# Patient Record
Sex: Male | Born: 1935 | Race: Black or African American | Hispanic: No | Marital: Married | Smoking: Former smoker
Health system: Southern US, Community
[De-identification: ages and names within clinical notes are randomized; demographics above are authoritative.]

## PROBLEM LIST (undated history)

## (undated) DIAGNOSIS — I10 Essential (primary) hypertension: Secondary | ICD-10-CM

## (undated) DIAGNOSIS — I2510 Atherosclerotic heart disease of native coronary artery without angina pectoris: Secondary | ICD-10-CM

## (undated) DIAGNOSIS — I499 Cardiac arrhythmia, unspecified: Secondary | ICD-10-CM

## (undated) HISTORY — DX: Cardiac arrhythmia, unspecified: I49.9

## (undated) HISTORY — PX: VASCULAR SURGERY: SHX849

---

## 2004-07-17 ENCOUNTER — Ambulatory Visit: Payer: Self-pay | Admitting: Family Medicine

## 2004-07-25 ENCOUNTER — Ambulatory Visit: Payer: Self-pay | Admitting: Family Medicine

## 2004-12-09 ENCOUNTER — Ambulatory Visit: Payer: Self-pay | Admitting: Family Medicine

## 2008-07-15 ENCOUNTER — Emergency Department: Payer: Self-pay | Admitting: Internal Medicine

## 2009-07-16 ENCOUNTER — Emergency Department: Payer: Self-pay | Admitting: Emergency Medicine

## 2010-11-10 ENCOUNTER — Emergency Department: Payer: Self-pay | Admitting: Emergency Medicine

## 2014-02-08 ENCOUNTER — Encounter: Payer: Self-pay | Admitting: Surgery

## 2014-02-12 ENCOUNTER — Encounter: Payer: Self-pay | Admitting: Surgery

## 2014-02-13 LAB — WOUND AEROBIC CULTURE

## 2014-03-14 ENCOUNTER — Encounter: Payer: Self-pay | Admitting: Surgery

## 2015-04-07 ENCOUNTER — Emergency Department
Admission: EM | Admit: 2015-04-07 | Discharge: 2015-04-07 | Disposition: A | Discharge status: Home / Self Care | Payer: Medicare HMO | Attending: Emergency Medicine | Admitting: Emergency Medicine

## 2015-04-07 ENCOUNTER — Encounter: Payer: Self-pay | Admitting: Emergency Medicine

## 2015-04-07 ENCOUNTER — Emergency Department: Payer: Medicare HMO

## 2015-04-07 DIAGNOSIS — I10 Essential (primary) hypertension: Secondary | ICD-10-CM | POA: Insufficient documentation

## 2015-04-07 DIAGNOSIS — Y998 Other external cause status: Secondary | ICD-10-CM | POA: Diagnosis not present

## 2015-04-07 DIAGNOSIS — R2243 Localized swelling, mass and lump, lower limb, bilateral: Secondary | ICD-10-CM | POA: Diagnosis not present

## 2015-04-07 DIAGNOSIS — S29001A Unspecified injury of muscle and tendon of front wall of thorax, initial encounter: Secondary | ICD-10-CM | POA: Diagnosis present

## 2015-04-07 DIAGNOSIS — R079 Chest pain, unspecified: Secondary | ICD-10-CM

## 2015-04-07 DIAGNOSIS — Y9389 Activity, other specified: Secondary | ICD-10-CM | POA: Insufficient documentation

## 2015-04-07 DIAGNOSIS — Y9289 Other specified places as the place of occurrence of the external cause: Secondary | ICD-10-CM | POA: Insufficient documentation

## 2015-04-07 DIAGNOSIS — Z87891 Personal history of nicotine dependence: Secondary | ICD-10-CM | POA: Insufficient documentation

## 2015-04-07 DIAGNOSIS — S20212A Contusion of left front wall of thorax, initial encounter: Secondary | ICD-10-CM | POA: Diagnosis not present

## 2015-04-07 DIAGNOSIS — W01198A Fall on same level from slipping, tripping and stumbling with subsequent striking against other object, initial encounter: Secondary | ICD-10-CM | POA: Diagnosis not present

## 2015-04-07 DIAGNOSIS — W19XXXA Unspecified fall, initial encounter: Secondary | ICD-10-CM

## 2015-04-07 HISTORY — DX: Atherosclerotic heart disease of native coronary artery without angina pectoris: I25.10

## 2015-04-07 HISTORY — DX: Essential (primary) hypertension: I10

## 2015-04-07 NOTE — ED Notes (Signed)
Pt states  2 days ago he lost his balance and fell hitting left rib area on cabinet, states he continues to have pain and it is hard to take a deep breath

## 2015-04-07 NOTE — ED Notes (Signed)
Pt reports a fall 2 days ago; states he lost his balance and fell into the kitchen counter hitting his left side.  Pt complaining of pain to left lower ribcage and left upper abdomen. Pt A/O x4, hypertensive, other vitals WDL. MD at bedside.

## 2015-04-07 NOTE — ED Provider Notes (Addendum)
[facility] Emergency Department Provider Note  ____________________________________________  Time seen: Approximately 135 PM  I have reviewed the triage vital signs and the nursing notes.   HISTORY  Chief Complaint Fall    HPI Louis Lawson is a 81 y.o. male with a history of hypertension and CAD who fell into cabinets 2 days ago hitting his left side. He says that he has had increased pain over the past 2 days and now it is difficult for him to take a deep breath. He took a pain pill of her relatives at home but he does not know what it is. Did not hit his head or lose consciousness since falling 2 days ago. No lightheadedness. Denies any abdominal pain.Denies being on any blood thinners. Says that he fell due to losing his footing while walking.   Past Medical History  Diagnosis Date  . Hypertension   . Coronary artery disease     There are no active problems to display for this patient.   History reviewed. No pertinent past surgical history.  No current outpatient prescriptions on file.  Allergies Review of patient's allergies indicates no known allergies.  No family history on file.  Social History Social History  Substance Use Topics  . Smoking status: Former Games developer  . Smokeless tobacco: None  . Alcohol Use: Yes    Review of Systems Constitutional: No fever/chills Eyes: No visual changes. ENT: No sore throat. Cardiovascular: Left sided chest pain  Respiratory: Denies shortness of breath. Gastrointestinal: No abdominal pain.  No nausea, no vomiting.  No diarrhea.  No constipation. Genitourinary: Negative for dysuria. Musculoskeletal: Negative for back pain. Skin: Negative for rash. Neurological: Negative for headaches, focal weakness or numbness.  10-point ROS otherwise negative.  ____________________________________________   PHYSICAL EXAM:  VITAL SIGNS: ED Triage Vitals  Enc Vitals Group     BP 04/07/15 1327 186/79 mmHg      Pulse Rate 04/07/15 1327 73     Resp 04/07/15 1327 18     Temp 04/07/15 1327 98.1 F (36.7 C)     Temp Source 04/07/15 1327 Oral     SpO2 04/07/15 1327 94 %     Weight 04/07/15 1327 220 lb (99.791 kg)     Height 04/07/15 1327  (1.778 m)     Head Cir --      Peak Flow --      Pain Score 04/07/15 1328 6     Pain Loc --      Pain Edu? --      Excl. in GC? --     Constitutional: Alert and oriented. Well appearing and in no acute distress. Eyes: Conjunctivae are normal. PERRL. EOMI. Head: Atraumatic. Nose: No congestion/rhinnorhea. Mouth/Throat: Mucous membranes are moist.  Oropharynx non-erythematous. Neck: No stridor.   Cardiovascular: Normal rate, regular rhythm. Grossly normal heart sounds.  Good peripheral circulation. Respiratory: Normal respiratory effort.  No retractions. Lungs CTAB. Gastrointestinal: Soft and nontender. No distention.No CVA tenderness. There is also not any bruising over the abdomen. Musculoskeletal: Bilateral lower extremity edema which is chronic.  No joint effusions. Tenderness to palpation anteriorly and just lateral to the sternum on the left side without any crepitus or ecchymosis or overlying deformity. Tenderness is mild Neurologic:  Normal speech and language. No gross focal neurologic deficits are appreciated. No gait instability. Skin:  Skin is warm, dry and intact. No rash noted. Psychiatric: Mood and affect are normal. Speech and behavior are normal.  ____________________________________________   LABS (  all labs ordered are listed, but only abnormal results are displayed)  Labs Reviewed - No data to display ____________________________________________  EKG   ____________________________________________  RADIOLOGY  Negative rib series to the left. ____________________________________________   PROCEDURES   ____________________________________________   INITIAL IMPRESSION / ASSESSMENT AND PLAN / ED COURSE  Pertinent labs  & imaging results that were available during my care of the patient were reviewed by me and considered in my medical decision making (see chart for details).  ----------------------------------------- 2:42 PM on 04/07/2015 -----------------------------------------  Patient resting comfortably at this time. Patient reexamined and very minimal tenderness over the entire left hemithorax and no tenderness over the left upper quadrant of the abdomen. Given the patient's disease course of increasing pain over the last 2 days this sounds more like muscle bruising and chest wall contusion. I believe there is an extremely low probability of there being any internal injury such as to the spleen. The patient's blood pressure was elevated today. If there is any significant internal bleeding I think he would be showing systemic signs by this point. I reviewed the results with the patient as well as his relative who is at the bedside. We discussed treatment options such as a warm heating pad as well as a muscle cream such as Aspercreme or icy hot. They understand the plan and are willing to comply. Will be discharged to home. Will follow up with primary care doctor. ____________________________________________   FINAL CLINICAL IMPRESSION(S) / ED DIAGNOSES  Left-sided chest contusion secondary to mechanical fall.    Myrna Blazeravid Matthew Franz Svec, MD 04/07/15 1443  Myrna Blazeravid Matthew Tessah Patchen, MD 04/07/15 [phone]

## 2015-04-07 NOTE — Discharge Instructions (Signed)
Chest Contusion °A contusion is a deep bruise. Bruises happen when an injury causes bleeding under the skin. Signs of bruising include pain, puffiness (swelling), and discolored skin. The bruise may turn blue, purple, or yellow.  °HOME CARE °· Put ice on the injured area. °¨ Put ice in a plastic bag. °¨ Place a towel between the skin and the bag. °¨ Leave the ice on for 15-20 minutes at a time, 03-04 times a day for the first 48 hours. °· Only take medicine as told by your doctor. °· Rest. °· Take deep breaths (deep-breathing exercises) as told by your doctor. °· Stop smoking if you smoke. °· Do not lift objects over 5 pounds (2.3 kilograms) for 3 days or longer if told by your doctor. °GET HELP RIGHT AWAY IF:  °· You have more bruising or puffiness. °· You have pain that gets worse. °· You have trouble breathing. °· You are dizzy, weak, or pass out (faint). °· You have blood in your pee (urine) or poop (stool). °· You cough up or throw up (vomit) blood. °· Your puffiness or pain is not helped with medicines. °MAKE SURE YOU:  °· Understand these instructions. °· Will watch your condition. °· Will get help right away if you are not doing well or get worse. °  °This information is not intended to replace advice given to you by your health care provider. Make sure you discuss any questions you have with your health care provider. °  °Document Released: 09/17/2007 Document Revised: 12/24/2011 Document Reviewed: 09/22/2011 °Elsevier Interactive Patient Education ©2016 [employer] ° °

## 2015-04-07 NOTE — ED Notes (Signed)
Discussed discharge instructions and follow-up care with patient. No questions or concerns at this time. Pt stable at discharge.  

## 2015-04-07 NOTE — ED Notes (Signed)
Patient transported to X-ray 

## 2016-03-11 ENCOUNTER — Ambulatory Visit
Admission: EM | Admit: 2016-03-11 | Discharge: 2016-03-11 | Disposition: A | Discharge status: Home / Self Care | Payer: Medicare HMO | Attending: Internal Medicine | Admitting: Internal Medicine

## 2016-03-11 DIAGNOSIS — L03221 Cellulitis of neck: Secondary | ICD-10-CM | POA: Diagnosis not present

## 2016-03-11 MED ORDER — SULFAMETHOXAZOLE-TRIMETHOPRIM 800-160 MG PO TABS: 1.0000 | ORAL_TABLET | Freq: Two times a day (BID) | ORAL | 0 refills | Status: DC

## 2016-03-11 MED ORDER — CEPHALEXIN 500 MG PO CAPS: 500.0000 mg | ORAL_CAPSULE | Freq: Two times a day (BID) | ORAL | 0 refills | Status: DC

## 2016-03-11 NOTE — ED Provider Notes (Signed)
[facility]    CSN: [id number] Arrival date & time: 03/11/16  1543     History   Chief Complaint Chief Complaint  Patient presents with  . Abscess    back of neck    HPI Louis Lawson is a 82 y.o. male.  He presents today with 1 wk hx red slightly painful swollen area on back of neck.  Previously seen ?10 yrs ago with this, lanced without return of pus. Abx helped.  No fever, no malaise, not draining.    HPI  Past Medical History:  Diagnosis Date  . Coronary artery disease   . Hypertension     History reviewed. No pertinent surgical history.     Home Medications    Prior to Admission medications   Medication Sig Start Date End Date Taking? Authorizing Provider  amLODipine (NORVASC) 5 MG tablet Take 5 mg by mouth daily.   Yes Historical Provider, MD  Cholecalciferol (VITAMIN D PO) Take by mouth.   Yes Historical Provider, MD  furosemide (LASIX) 40 MG tablet Take 40 mg by mouth.   Yes Historical Provider, MD  metoprolol succinate (TOPROL-XL) 50 MG 24 hr tablet Take 50 mg by mouth daily. Take with or immediately following a meal.   Yes Historical Provider, MD  ramipril (ALTACE) 10 MG capsule Take 10 mg by mouth daily.   Yes Historical Provider, MD  simvastatin (ZOCOR) 20 MG tablet Take 20 mg by mouth daily.   Yes Historical Provider, MD  cephALEXin (KEFLEX) 500 MG capsule Take 1 capsule (500 mg total) by mouth 2 (two) times daily. 03/11/16   Eustace Moore, MD  sulfamethoxazole-trimethoprim (BACTRIM DS,SEPTRA DS) 800-160 MG tablet Take 1 tablet by mouth 2 (two) times daily. 03/11/16 03/21/16  Eustace Moore, MD    Family History History reviewed. No pertinent family history.  Social History Social History  Substance Use Topics  . Smoking status: Former Games developer  . Smokeless tobacco: Never Used  . Alcohol use Yes     Allergies   Patient has no known allergies.   Review of Systems Review of Systems  All other systems reviewed and are  negative.    Physical Exam Triage Vital Signs ED Triage Vitals  Enc Vitals Group     BP 03/11/16 1825 (!) 184/67     Pulse Rate 03/11/16 1825 73     Resp 03/11/16 1825 19     Temp 03/11/16 1825 97.6 F (36.4 C)     Temp Source 03/11/16 1825 Oral     SpO2 03/11/16 1825 100 %     Weight 03/11/16 1635 215 lb (97.5 kg)     Height 03/11/16 1635  (1.753 m)     Pain Score 03/11/16 1639 5   Updated Vital Signs BP (!) 184/67 (BP Location: Right Arm)   Pulse 73   Temp 97.6 F (36.4 C) (Oral)   Resp 19   Ht  (1.753 m)   Wt 215 lb (97.5 kg)   SpO2 100%   BMI 31.75 kg/m  Physical Exam  Constitutional: He is oriented to person, place, and time. No distress.  Alert, nicely groomed Sitting in wheelchair  HENT:  Head: Atraumatic.  Eyes:  Conjugate gaze, no eye redness/drainage  Neck: Neck supple.  Cardiovascular: Normal rate.   Pulmonary/Chest: No respiratory distress.  Abdominal: He exhibits no distension.  Musculoskeletal: Normal range of motion.  Neurological: He is alert and oriented to person, place, and time.  Skin: Skin is  warm and dry.  3" deep red indurated/raised lesion back of neck, minimal tenderness, not really fluctuant.  Not pointing.  Nursing note and vitals reviewed.    UC Treatments / Results   Procedures Procedures (including critical care time) Back of neck prepped with hibiclens and bleb of lidocaine 1% placed.  Attempt made x 3 to aspirate from lesion with 18 ga needle/10cc syringe, without success.  Bandaid applied.  Final Clinical Impressions(s) / UC Diagnoses   Final diagnoses:  Cellulitis, neck   Prescriptions for antibiotics sent to the [facility] in [place].  Recheck in 1 week if not improving, or if new fever>100.5 or increasing redness/swelling/pain/drainage.    New Prescriptions New Prescriptions   CEPHALEXIN (KEFLEX) 500 MG CAPSULE    Take 1 capsule (500 mg total) by mouth 2 (two) times daily.   SULFAMETHOXAZOLE-TRIMETHOPRIM  (BACTRIM DS,SEPTRA DS) 800-160 MG TABLET    Take 1 tablet by mouth 2 (two) times daily.     Eustace MooreLaura W Jerrik Housholder, MD 03/16/16 2236

## 2016-03-11 NOTE — Discharge Instructions (Addendum)
Prescriptions for antibiotics sent to the [facility] in [place].  Recheck in 1 week if not improving, or if new fever>100.5 or increasing redness/swelling/pain/drainage.

## 2016-03-11 NOTE — ED Triage Notes (Signed)
Pt has an abscess on the back of his neck, he had an abscess there about 8 years ago and had surgery on it. This time is started to fester about a week ago.

## 2016-03-18 ENCOUNTER — Ambulatory Visit
Admission: EM | Admit: 2016-03-18 | Discharge: 2016-03-18 | Disposition: A | Discharge status: Home / Self Care | Payer: Medicare HMO | Attending: Emergency Medicine | Admitting: Emergency Medicine

## 2016-03-18 DIAGNOSIS — L723 Sebaceous cyst: Secondary | ICD-10-CM

## 2016-03-18 DIAGNOSIS — IMO0002 Reserved for concepts with insufficient information to code with codable children: Secondary | ICD-10-CM

## 2016-03-18 DIAGNOSIS — L03818 Cellulitis of other sites: Secondary | ICD-10-CM | POA: Diagnosis not present

## 2016-03-18 MED ORDER — DOXYCYCLINE HYCLATE 100 MG PO CAPS: 100.0000 mg | ORAL_CAPSULE | Freq: Two times a day (BID) | ORAL | 0 refills | Status: AC

## 2016-03-18 NOTE — ED Triage Notes (Signed)
Patient states that he is here for a recheck of his abscess on his neck. Patient states that area feels somewhat better, soreness has improved some.

## 2016-03-18 NOTE — Discharge Instructions (Signed)
Stop the Bactrim. Start the doxycycline instead. You will finish the doxycycline on the same day that you finish the Keflex. Make sure you continue the Keflex. 500 mg of Tylenol 4 times a day as needed for pain.

## 2016-03-18 NOTE — ED Provider Notes (Addendum)
HPI  SUBJECTIVE:  Louis Lawson is a 82 y.o. male who presents with persistent redness, pain, swelling on the posterior aspect of his neck. States that the mass has been there for "several months". He states is getting slightly better, but is not getting worse. Denies fevers, drainage, states that he is compliant with his antibiotics. He is requesting something for pain, he has tried Tylenol ES one tab which she states helps. He has a past medical history of hypertension, hypercholesterolemia, gout, lower extremity edema, no history of kidney disease, diabetes.  Was seen here at this facility on 11/28 with an abscess on his neck. Several attempts made to aspirate from the lesion on 18-gauge needle without success. He was started on Keflex and Bactrim.    Past Medical History:  Diagnosis Date  . Coronary artery disease   . Hypertension     History reviewed. No pertinent surgical history.  History reviewed. No pertinent family history.  Social History  Substance Use Topics  . Smoking status: Former Games developermoker  . Smokeless tobacco: Never Used  . Alcohol use Yes    No current facility-administered medications for this encounter.   Current Outpatient Prescriptions:  .  amLODipine (NORVASC) 5 MG tablet, Take 5 mg by mouth daily., Disp: , Rfl:  .  cephALEXin (KEFLEX) 500 MG capsule, Take 1 capsule (500 mg total) by mouth 2 (two) times daily., Disp: 20 capsule, Rfl: 0 .  Cholecalciferol (VITAMIN D PO), Take by mouth., Disp: , Rfl:  .  furosemide (LASIX) 40 MG tablet, Take 40 mg by mouth., Disp: , Rfl:  .  metoprolol succinate (TOPROL-XL) 50 MG 24 hr tablet, Take 50 mg by mouth daily. Take with or immediately following a meal., Disp: , Rfl:  .  ramipril (ALTACE) 10 MG capsule, Take 10 mg by mouth daily., Disp: , Rfl:  .  simvastatin (ZOCOR) 20 MG tablet, Take 20 mg by mouth daily., Disp: , Rfl:  .  doxycycline (VIBRAMYCIN) 100 MG capsule, Take 1 capsule (100 mg total) by mouth 2 (two) times  daily., Disp: 6 capsule, Rfl: 0  No Known Allergies   ROS  As noted in HPI.   Physical Exam  BP (!) 166/65 (BP Location: Left Arm)   Pulse (!) 55   Temp 97.7 F (36.5 C) (Oral)   Resp 17   Ht 5\' 9"  (1.753 m)   Wt 215 lb (97.5 kg)   SpO2 100%   BMI 31.75 kg/m   Constitutional: Well developed, well nourished, no acute distress Eyes:  EOMI, conjunctiva normal bilaterally HENT: Normocephalic, atraumatic,mucus membranes moist Respiratory: Normal inspiratory effort Cardiovascular: Normal rate GI: nondistended skin:5.5 cm x 5.5 cm tender area of erythema, induration with central fluctuance posterior middle neck.  Lymph: No appreciable cervical lymphadenopathy.  Musculoskeletal: no deformities Neurologic: Alert & oriented x 3, no focal neuro deficits Psychiatric: Speech and behavior appropriate   ED Course   Medications - No data to display  Orders Placed This Encounter  Procedures  . Deep Wound or Surgical Culture (Abcess or Tissue Only)    Standing Status:   Standing    Number of Occurrences:   1    Order Specific Question:   Patient immune status    Answer:   Normal  . Ambulatory referral to General Surgery    Referral Priority:   Urgent    Referral Type:   Surgical    Referral Reason:   Specialty Services Required    Requested Specialty:   General  Surgery    Number of Visits Requested:   1    No results found for this or any previous visit (from the past 24 hour(s)). No results found.  ED Clinical Impression  Cyst of neck  Cellulitis of other specified site   ED Assessment/Plan  Previous records reviewed as noted in history of present illness  Procedure note, cleaned area with chlorhexidine and  alcohol. Marked area of induration with skin marker for reference. Anesthetized the area with 0.5 cc of plain lidocaine 1% with adequate anesthesia. Made a single stab wound with an 11 blade. Did not express any purulent material. Explored wound, and it appears  to be a fibrotic mass Sterile pressure dressing placed. Patient tolerated procedure well.  This is most likely a sebaceous cyst vs other cyst with some superficial cellulitis.  We'll have patient continue Keflex, DC the Bactrim due to concerns for hyperkalemia with the ACE inhibitor and start him on doxycycline to cover MRSA. He may take Tylenol 500 mg up to 4 times a day as needed for pain. We'll have him follow-up with  Dr. Everlene FarrierPabon  general surgeon on call to have this definitively removed. To the ER if gets worse.   Discussed sn/sx that should prompt return to the ED. Patient and family agrees with plan.   Meds ordered this encounter  Medications  . doxycycline (VIBRAMYCIN) 100 MG capsule    Sig: Take 1 capsule (100 mg total) by mouth 2 (two) times daily.    Dispense:  6 capsule    Refill:  0    *This clinic note was created using Scientist, clinical (histocompatibility and immunogenetics)Dragon dictation software. Therefore, there may be occasional mistakes despite careful proofreading.  ?    Domenick GongAshley Soloman Mckeithan, MD 03/18/16 1303    Domenick GongAshley Arnav Cregg, MD 03/18/16 [phone]

## 2016-03-28 ENCOUNTER — Ambulatory Visit (INDEPENDENT_AMBULATORY_CARE_PROVIDER_SITE_OTHER): Payer: Medicare HMO | Admitting: Surgery

## 2016-03-28 ENCOUNTER — Encounter: Payer: Self-pay | Admitting: Surgery

## 2016-03-28 VITALS — BP 178/81 | HR 77 | Temp 97.3°F | Ht 69.0 in | Wt 220.0 lb

## 2016-03-28 DIAGNOSIS — L723 Sebaceous cyst: Secondary | ICD-10-CM | POA: Diagnosis not present

## 2016-03-28 DIAGNOSIS — L089 Local infection of the skin and subcutaneous tissue, unspecified: Secondary | ICD-10-CM | POA: Diagnosis not present

## 2016-03-28 MED ORDER — DOXYCYCLINE HYCLATE 50 MG PO CAPS: 100.0000 mg | ORAL_CAPSULE | Freq: Two times a day (BID) | ORAL | 0 refills | Status: DC

## 2016-03-28 NOTE — Progress Notes (Signed)
Patient ID: Louis Lawson, male   DOB: February 28, 1923, 82 y.o.   MRN: [id number]  HPI Louis Lawson is a 82 y.o. male seen for an infected sebaceous cyst on the nuchal area. He states that he has had this cyst for several months but over the last week or so now has had some redness, some intermittent mild to moderate sharp pain that is not radiated. No fevers no chills he recently went to the emergency room 10 days ago and was prescribed doxycycline. There has been some improvement of symptoms. Patient has history of coronary artery disease and irregular heartbeat. He comes in with his son and he is in a wheelchair. He has decreased mobility and is somewhat debilitated  HPI  Past Medical History:  Diagnosis Date  . Coronary artery disease   . Hypertension   . Irregular heart beat     Past Surgical History:  Procedure Laterality Date  . VASCULAR SURGERY Right    Lower Extremity- secondary to DVT    Family History  Problem Relation Age of Onset  . Hypertension Sister     Social History Social History  Substance Use Topics  . Smoking status: Former Smoker    Quit date: 03/28/1964  . Smokeless tobacco: Never Used  . Alcohol use Yes     Comment: Occasional    No Known Allergies  Current Outpatient Prescriptions  Medication Sig Dispense Refill  . allopurinol (ZYLOPRIM) 300 MG tablet Take 1 tablet by mouth daily.    Marland Kitchen. amLODipine (NORVASC) 10 MG tablet Take 1 tablet by mouth daily.    . Cholecalciferol (VITAMIN D PO) Take by mouth.    . furosemide (LASIX) 40 MG tablet Take 40 mg by mouth.    . metoprolol succinate (TOPROL-XL) 50 MG 24 hr tablet Take 50 mg by mouth daily. Take with or immediately following a meal.    . ramipril (ALTACE) 10 MG capsule Take 10 mg by mouth daily.    . simvastatin (ZOCOR) 20 MG tablet Take 20 mg by mouth daily.    Marland Kitchen. doxycycline (VIBRAMYCIN) 50 MG capsule Take 2 capsules (100 mg total) by mouth 2 (two) times daily. 20 capsule 0   No current  facility-administered medications for this visit.      Review of Systems A 10 point review of systems was asked and was negative except for the information on the HPI  Physical Exam Blood pressure (!) 178/81, pulse 77, temperature 97.3 F (36.3 C), temperature source Oral, height 5\' 9"  (1.753 m), weight 99.8 kg (220 lb). CONSTITUTIONAL: No acute distress, nontoxic EYES: Pupils are equal, round, and reactive to light, Sclera are non-icteric. EARS, NOSE, MOUTH AND THROAT: The oropharynx is clear. The oral mucosa is pink and moist. Hearing is intact to voice. LYMPH NODES:  Lymph nodes in the neck are normal. RESPIRATORY: There is normal respiratory effort, without pathologic use of accessory muscles. GI: The abdomen is  soft, nontender, and nondistended. There are no palpable masses. There is no hepatosplenomegaly. There are normal bowel sounds in all quadrants. GU: Rectal deferred.   MUSCULOSKELETAL: Ext well perfused  No cyanosis or edema.   SKIN: There is a 3 cm x 4 cm infected sebaceous cyst on the posterior nuchal area. There is some white headaches but no evidence of abscess or necrotizing infection. NEUROLOGIC: Motor and sensation is grossly normal. Cranial nerves are grossly intact. PSYCH:  Oriented to person, place and time. Affect is normal.  Data Reviewed  I have personally reviewed  the patient's imaging, laboratory findings and medical records.    Assessment/ Plan Infected epidermal inclusion cyst. We'll treat her with another course of doxycycline since he responded very well with this. And eventually this will need to be excised was the inflammation and the infection resolves. Discussed with patient in detail that if obviously this does not improve may need to do a wider excision and left the wound open to heal by secondary intention. At this point there is no need for any emergency procedures at this time. Discussed with the patient in detail about his disease process and he  understands.  Sterling Bigiego Maisen Klingler, MD FACS General Surgeon 03/28/2016, 11:51 AM

## 2016-03-28 NOTE — Patient Instructions (Signed)
We will see you back in 1 month. Please see your appointment below.  Pick up your antibiotics and begin today. You will take these twice daily until they are gone. If this area begins to have worsening redness, inflammation or you develop a fever > 100.5, nausea/vomiting- call our office and speak with a nurse immediately.

## 2016-05-02 ENCOUNTER — Ambulatory Visit: Payer: Self-pay | Admitting: Surgery

## 2016-05-15 ENCOUNTER — Ambulatory Visit: Payer: Self-pay | Admitting: Surgery

## 2016-11-04 ENCOUNTER — Inpatient Hospital Stay: Payer: Medicare HMO

## 2016-11-04 ENCOUNTER — Inpatient Hospital Stay
Admission: EM | Admit: 2016-11-04 | Discharge: 2016-11-11 | DRG: 853 | Disposition: A | Discharge status: Skilled Nursing Facility | Payer: Medicare HMO | Attending: Internal Medicine | Admitting: Internal Medicine

## 2016-11-04 ENCOUNTER — Emergency Department: Payer: Medicare HMO | Admitting: Certified Registered"

## 2016-11-04 ENCOUNTER — Emergency Department: Payer: Medicare HMO

## 2016-11-04 ENCOUNTER — Encounter
Admission: EM | Disposition: A | Discharge status: Skilled Nursing Facility | Payer: Self-pay | Source: Home / Self Care | Attending: Internal Medicine

## 2016-11-04 DIAGNOSIS — K529 Noninfective gastroenteritis and colitis, unspecified: Secondary | ICD-10-CM

## 2016-11-04 DIAGNOSIS — K429 Umbilical hernia without obstruction or gangrene: Secondary | ICD-10-CM | POA: Diagnosis present

## 2016-11-04 DIAGNOSIS — I447 Left bundle-branch block, unspecified: Secondary | ICD-10-CM | POA: Diagnosis present

## 2016-11-04 DIAGNOSIS — R509 Fever, unspecified: Secondary | ICD-10-CM | POA: Diagnosis present

## 2016-11-04 DIAGNOSIS — K352 Acute appendicitis with generalized peritonitis: Secondary | ICD-10-CM | POA: Diagnosis not present

## 2016-11-04 DIAGNOSIS — J9601 Acute respiratory failure with hypoxia: Secondary | ICD-10-CM | POA: Diagnosis not present

## 2016-11-04 DIAGNOSIS — Z79899 Other long term (current) drug therapy: Secondary | ICD-10-CM

## 2016-11-04 DIAGNOSIS — I10 Essential (primary) hypertension: Secondary | ICD-10-CM | POA: Diagnosis present

## 2016-11-04 DIAGNOSIS — I2510 Atherosclerotic heart disease of native coronary artery without angina pectoris: Secondary | ICD-10-CM | POA: Diagnosis present

## 2016-11-04 DIAGNOSIS — K631 Perforation of intestine (nontraumatic): Secondary | ICD-10-CM | POA: Diagnosis not present

## 2016-11-04 DIAGNOSIS — K3532 Acute appendicitis with perforation and localized peritonitis, without abscess: Secondary | ICD-10-CM | POA: Diagnosis present

## 2016-11-04 DIAGNOSIS — A419 Sepsis, unspecified organism: Secondary | ICD-10-CM | POA: Diagnosis present

## 2016-11-04 DIAGNOSIS — J9690 Respiratory failure, unspecified, unspecified whether with hypoxia or hypercapnia: Secondary | ICD-10-CM | POA: Diagnosis present

## 2016-11-04 DIAGNOSIS — Z87891 Personal history of nicotine dependence: Secondary | ICD-10-CM

## 2016-11-04 HISTORY — PX: APPENDECTOMY: SHX54

## 2016-11-04 HISTORY — PX: LAPAROTOMY: SHX154

## 2016-11-04 HISTORY — PX: UMBILICAL HERNIA REPAIR: SHX196

## 2016-11-04 LAB — CBC WITH DIFFERENTIAL/PLATELET
BASOS PCT: 0 %
Basophils Absolute: 0 10*3/uL (ref 0–0.1)
Eosinophils Absolute: 0 10*3/uL (ref 0–0.7)
Eosinophils Relative: 0 %
HEMATOCRIT: 39.8 % — AB (ref 40.0–52.0)
HEMOGLOBIN: 13.4 g/dL (ref 13.0–18.0)
LYMPHS ABS: 1.2 10*3/uL (ref 1.0–3.6)
Lymphocytes Relative: 8 %
MCH: 31.5 pg (ref 26.0–34.0)
MCHC: 33.6 g/dL (ref 32.0–36.0)
MCV: 93.6 fL (ref 80.0–100.0)
MONO ABS: 1.2 10*3/uL — AB (ref 0.2–1.0)
MONOS PCT: 8 %
NEUTROS ABS: 13.1 10*3/uL — AB (ref 1.4–6.5)
NEUTROS PCT: 84 %
PLATELETS: 165 10*3/uL (ref 150–440)
RBC: 4.26 MIL/uL — ABNORMAL LOW (ref 4.40–5.90)
RDW: 15.2 % — ABNORMAL HIGH (ref 11.5–14.5)
WBC: 15.5 10*3/uL — ABNORMAL HIGH (ref 3.8–10.6)

## 2016-11-04 LAB — URINALYSIS, COMPLETE (UACMP) WITH MICROSCOPIC
BILIRUBIN URINE: NEGATIVE
Bacteria, UA: NONE SEEN
GLUCOSE, UA: NEGATIVE mg/dL
KETONES UR: NEGATIVE mg/dL
Leukocytes, UA: NEGATIVE
NITRITE: NEGATIVE
PH: 7 (ref 5.0–8.0)
Protein, ur: >=300 mg/dL — AB
SPECIFIC GRAVITY, URINE: 1.015 (ref 1.005–1.030)

## 2016-11-04 LAB — COMPREHENSIVE METABOLIC PANEL
ALBUMIN: 3.8 g/dL (ref 3.5–5.0)
ALT: 14 U/L — AB (ref 17–63)
ALT: 14 U/L — AB (ref 17–63)
ANION GAP: 10 (ref 5–15)
AST: 29 U/L (ref 15–41)
AST: 35 U/L (ref 15–41)
Albumin: 3.1 g/dL — ABNORMAL LOW (ref 3.5–5.0)
Alkaline Phosphatase: 47 U/L (ref 38–126)
Alkaline Phosphatase: 41 U/L (ref 38–126)
Anion gap: 10 (ref 5–15)
BUN: 23 mg/dL — AB (ref 6–20)
BUN: 22 mg/dL — ABNORMAL HIGH (ref 6–20)
CALCIUM: 8.1 mg/dL — AB (ref 8.9–10.3)
CHLORIDE: 99 mmol/L — AB (ref 101–111)
CHLORIDE: 101 mmol/L (ref 101–111)
CO2: 26 mmol/L (ref 22–32)
CO2: 23 mmol/L (ref 22–32)
CREATININE: 1.62 mg/dL — AB (ref 0.61–1.24)
CREATININE: 1.55 mg/dL — AB (ref 0.61–1.24)
Calcium: 9 mg/dL (ref 8.9–10.3)
GFR calc Af Amer: 41 mL/min — ABNORMAL LOW (ref >60)
GFR calc non Af Amer: 35 mL/min — ABNORMAL LOW (ref >60)
GFR, EST AFRICAN AMERICAN: 43 mL/min — AB (ref >60)
GFR, EST NON AFRICAN AMERICAN: 37 mL/min — AB (ref >60)
GLUCOSE: 137 mg/dL — AB (ref 65–99)
Glucose, Bld: 148 mg/dL — ABNORMAL HIGH (ref 65–99)
Potassium: 4 mmol/L (ref 3.5–5.1)
Potassium: 3.4 mmol/L — ABNORMAL LOW (ref 3.5–5.1)
SODIUM: 135 mmol/L (ref 135–145)
SODIUM: 134 mmol/L — AB (ref 135–145)
Total Bilirubin: 2 mg/dL — ABNORMAL HIGH (ref 0.3–1.2)
Total Bilirubin: 1.9 mg/dL — ABNORMAL HIGH (ref 0.3–1.2)
Total Protein: 8.1 g/dL (ref 6.5–8.1)
Total Protein: 6.9 g/dL (ref 6.5–8.1)

## 2016-11-04 LAB — TROPONIN I: Troponin I: 0.06 ng/mL (ref <0.03)

## 2016-11-04 LAB — MAGNESIUM: MAGNESIUM: 1.1 mg/dL — AB (ref 1.7–2.4)

## 2016-11-04 LAB — CBC
HCT: 45 % (ref 40.0–52.0)
HEMOGLOBIN: 15 g/dL (ref 13.0–18.0)
MCH: 32.1 pg (ref 26.0–34.0)
MCHC: 33.5 g/dL (ref 32.0–36.0)
MCV: 95.9 fL (ref 80.0–100.0)
PLATELETS: 156 10*3/uL (ref 150–440)
RBC: 4.69 MIL/uL (ref 4.40–5.90)
RDW: 15.4 % — AB (ref 11.5–14.5)
WBC: 7.2 10*3/uL (ref 3.8–10.6)

## 2016-11-04 LAB — BLOOD GAS, ARTERIAL
Acid-base deficit: 1.8 mmol/L (ref 0.0–2.0)
Bicarbonate: 24.3 mmol/L (ref 20.0–28.0)
FIO2: 0.3
MECHVT: 500 mL
O2 Saturation: 87.3 %
PATIENT TEMPERATURE: 37
PEEP: 5 cmH2O
PO2 ART: 57 mmHg — AB (ref 83.0–108.0)
RATE: 15 resp/min
pCO2 arterial: 45 mmHg (ref 32.0–48.0)
pH, Arterial: 7.34 — ABNORMAL LOW (ref 7.350–7.450)

## 2016-11-04 LAB — LACTIC ACID, PLASMA
LACTIC ACID, VENOUS: 3.6 mmol/L — AB (ref 0.5–1.9)
Lactic Acid, Venous: 2.6 mmol/L (ref 0.5–1.9)

## 2016-11-04 LAB — TRIGLYCERIDES: Triglycerides: 73 mg/dL (ref <150)

## 2016-11-04 LAB — MRSA PCR SCREENING: MRSA BY PCR: NEGATIVE

## 2016-11-04 LAB — LIPASE, BLOOD: Lipase: 21 U/L (ref 11–51)

## 2016-11-04 LAB — PHOSPHORUS: PHOSPHORUS: 3.2 mg/dL (ref 2.5–4.6)

## 2016-11-04 LAB — PROCALCITONIN: Procalcitonin: 1.35 ng/mL

## 2016-11-04 SURGERY — LAPAROTOMY, EXPLORATORY
Anesthesia: General | Site: Abdomen

## 2016-11-04 MED ORDER — FENTANYL CITRATE (PF) 100 MCG/2ML IJ SOLN
INTRAMUSCULAR | Status: AC
  Filled 2016-11-04: qty 2

## 2016-11-04 MED ORDER — SODIUM CHLORIDE 0.9% FLUSH
3.0000 mL | Freq: Two times a day (BID) | INTRAVENOUS | Status: DC
  Administered 2016-11-04 – 2016-11-11 (×11): 3 mL via INTRAVENOUS

## 2016-11-04 MED ORDER — SODIUM CHLORIDE 0.9 % IV SOLN: 1000.0000 mL | Freq: Once | INTRAVENOUS | Status: DC

## 2016-11-04 MED ORDER — ROCURONIUM BROMIDE 50 MG/5ML IV SOLN
INTRAVENOUS | Status: AC
  Filled 2016-11-04: qty 1

## 2016-11-04 MED ORDER — BUPIVACAINE LIPOSOME 1.3 % IJ SUSP
INTRAMUSCULAR | Status: AC
  Filled 2016-11-04: qty 20

## 2016-11-04 MED ORDER — ALBUMIN HUMAN 25 % IV SOLN
12.5000 g | Freq: Once | INTRAVENOUS | Status: AC
  Administered 2016-11-04: 12.5 g via INTRAVENOUS
  Filled 2016-11-04: qty 50

## 2016-11-04 MED ORDER — PROPOFOL 10 MG/ML IV BOLUS
INTRAVENOUS | Status: AC
  Filled 2016-11-04: qty 20

## 2016-11-04 MED ORDER — SUCCINYLCHOLINE CHLORIDE 20 MG/ML IJ SOLN
INTRAMUSCULAR | Status: DC | PRN
  Administered 2016-11-04: 100 mg via INTRAVENOUS

## 2016-11-04 MED ORDER — ONDANSETRON HCL 4 MG/2ML IJ SOLN
INTRAMUSCULAR | Status: DC | PRN
  Administered 2016-11-04: 4 mg via INTRAVENOUS

## 2016-11-04 MED ORDER — FENTANYL BOLUS VIA INFUSION
25.0000 ug | INTRAVENOUS | Status: DC | PRN
  Filled 2016-11-04: qty 25

## 2016-11-04 MED ORDER — FAMOTIDINE IN NACL 20-0.9 MG/50ML-% IV SOLN
20.0000 mg | Freq: Two times a day (BID) | INTRAVENOUS | Status: DC
  Administered 2016-11-04 – 2016-11-09 (×11): 20 mg via INTRAVENOUS
  Filled 2016-11-04 (×13): qty 50

## 2016-11-04 MED ORDER — DEXAMETHASONE SODIUM PHOSPHATE 10 MG/ML IJ SOLN
INTRAMUSCULAR | Status: DC | PRN
  Administered 2016-11-04: 5 mg via INTRAVENOUS

## 2016-11-04 MED ORDER — PANTOPRAZOLE SODIUM 40 MG IV SOLR
40.0000 mg | Freq: Two times a day (BID) | INTRAVENOUS | Status: DC
  Administered 2016-11-04: 40 mg via INTRAVENOUS
  Filled 2016-11-04: qty 40

## 2016-11-04 MED ORDER — LACTATED RINGERS IV SOLN
INTRAVENOUS | Status: DC | PRN
  Administered 2016-11-04: 15:00:00 via INTRAVENOUS

## 2016-11-04 MED ORDER — PIPERACILLIN-TAZOBACTAM 3.375 G IVPB 30 MIN
3.3750 g | Freq: Once | INTRAVENOUS | Status: AC
  Administered 2016-11-04: 3.375 g via INTRAVENOUS
  Filled 2016-11-04: qty 50

## 2016-11-04 MED ORDER — FENTANYL CITRATE (PF) 2500 MCG/50ML IJ SOLN: 25.0000 ug/h | INTRAMUSCULAR | Status: DC

## 2016-11-04 MED ORDER — SODIUM CHLORIDE 0.9 % IV SOLN: 250.0000 mL | INTRAVENOUS | Status: DC | PRN

## 2016-11-04 MED ORDER — IOPAMIDOL (ISOVUE-300) INJECTION 61%
100.0000 mL | Freq: Once | INTRAVENOUS | Status: AC | PRN
  Administered 2016-11-04: 75 mL via INTRAVENOUS

## 2016-11-04 MED ORDER — PANTOPRAZOLE SODIUM 40 MG IV SOLR: 40.0000 mg | INTRAVENOUS | Status: DC

## 2016-11-04 MED ORDER — SODIUM CHLORIDE 0.9% FLUSH: 3.0000 mL | INTRAVENOUS | Status: DC | PRN

## 2016-11-04 MED ORDER — HEPARIN SODIUM (PORCINE) 5000 UNIT/ML IJ SOLN
5000.0000 [IU] | Freq: Three times a day (TID) | INTRAMUSCULAR | Status: DC
  Administered 2016-11-04 – 2016-11-11 (×21): 5000 [IU] via SUBCUTANEOUS
  Filled 2016-11-04 (×21): qty 1

## 2016-11-04 MED ORDER — ACETAMINOPHEN 325 MG PO TABS
650.0000 mg | ORAL_TABLET | ORAL | Status: DC | PRN
  Administered 2016-11-07 – 2016-11-10 (×3): 650 mg via ORAL
  Filled 2016-11-04 (×4): qty 2

## 2016-11-04 MED ORDER — SUCCINYLCHOLINE CHLORIDE 20 MG/ML IJ SOLN
INTRAMUSCULAR | Status: AC
  Filled 2016-11-04: qty 1

## 2016-11-04 MED ORDER — FENTANYL CITRATE (PF) 100 MCG/2ML IJ SOLN
50.0000 ug | Freq: Once | INTRAMUSCULAR | Status: DC
Start: 2016-11-04 — End: 2016-11-10

## 2016-11-04 MED ORDER — ONDANSETRON HCL 4 MG/2ML IJ SOLN: 4.0000 mg | Freq: Four times a day (QID) | INTRAMUSCULAR | Status: DC | PRN

## 2016-11-04 MED ORDER — LACTATED RINGERS IV SOLN
INTRAVENOUS | Status: DC
  Administered 2016-11-04: 18:00:00 via INTRAVENOUS
  Administered 2016-11-05: 150 mL/h via INTRAVENOUS
  Administered 2016-11-05: 01:00:00 via INTRAVENOUS
  Administered 2016-11-05: 150 mL/h via INTRAVENOUS
  Administered 2016-11-05 – 2016-11-08 (×8): via INTRAVENOUS

## 2016-11-04 MED ORDER — VANCOMYCIN HCL IN DEXTROSE 1-5 GM/200ML-% IV SOLN
1000.0000 mg | Freq: Once | INTRAVENOUS | Status: AC
  Administered 2016-11-04: 1000 mg via INTRAVENOUS
  Filled 2016-11-04: qty 200

## 2016-11-04 MED ORDER — EPINEPHRINE PF 1 MG/ML IJ SOLN
INTRAMUSCULAR | Status: AC
  Filled 2016-11-04: qty 1

## 2016-11-04 MED ORDER — GLYCOPYRROLATE 0.2 MG/ML IJ SOLN
INTRAMUSCULAR | Status: AC
  Filled 2016-11-04: qty 1

## 2016-11-04 MED ORDER — DEXAMETHASONE SODIUM PHOSPHATE 10 MG/ML IJ SOLN
INTRAMUSCULAR | Status: AC
  Filled 2016-11-04: qty 1

## 2016-11-04 MED ORDER — LIDOCAINE HCL (CARDIAC) 20 MG/ML IV SOLN
INTRAVENOUS | Status: DC | PRN
  Administered 2016-11-04: 100 mg via INTRAVENOUS

## 2016-11-04 MED ORDER — LACTATED RINGERS IV SOLN
INTRAVENOUS | Status: DC | PRN
  Administered 2016-11-04: 16:00:00 via INTRAVENOUS

## 2016-11-04 MED ORDER — BUPIVACAINE-EPINEPHRINE (PF) 0.25% -1:200000 IJ SOLN
INTRAMUSCULAR | Status: DC | PRN
  Administered 2016-11-04: 30 mL via PERINEURAL

## 2016-11-04 MED ORDER — PROPOFOL 1000 MG/100ML IV EMUL: 5.0000 ug/kg/min | INTRAVENOUS | Status: DC

## 2016-11-04 MED ORDER — BUPIVACAINE HCL (PF) 0.25 % IJ SOLN
INTRAMUSCULAR | Status: AC
  Filled 2016-11-04: qty 30

## 2016-11-04 MED ORDER — SODIUM CHLORIDE 0.9 % IV SOLN
INTRAVENOUS | Status: DC | PRN
  Administered 2016-11-04: 60 mL

## 2016-11-04 MED ORDER — SODIUM CHLORIDE 0.9 % IV BOLUS (SEPSIS): 1000.0000 mL | Freq: Once | INTRAVENOUS | Status: DC

## 2016-11-04 MED ORDER — VANCOMYCIN HCL IN DEXTROSE 1-5 GM/200ML-% IV SOLN
1000.0000 mg | INTRAVENOUS | Status: DC
  Filled 2016-11-04: qty 200

## 2016-11-04 MED ORDER — SODIUM CHLORIDE 0.9 % IJ SOLN
INTRAMUSCULAR | Status: AC
  Filled 2016-11-04: qty 50

## 2016-11-04 MED ORDER — ONDANSETRON 4 MG PO TBDP
4.0000 mg | ORAL_TABLET | Freq: Four times a day (QID) | ORAL | Status: DC | PRN
  Filled 2016-11-04: qty 1

## 2016-11-04 MED ORDER — PROPOFOL 10 MG/ML IV BOLUS
INTRAVENOUS | Status: DC | PRN
  Administered 2016-11-04: 100 mg via INTRAVENOUS

## 2016-11-04 MED ORDER — SODIUM CHLORIDE 0.9 % IV SOLN
1000.0000 mL | Freq: Once | INTRAVENOUS | Status: AC
  Administered 2016-11-04: 1000 mL via INTRAVENOUS

## 2016-11-04 MED ORDER — FENTANYL 2500MCG IN NS 250ML (10MCG/ML) PREMIX INFUSION
25.0000 ug/h | INTRAVENOUS | Status: DC
Start: 2016-11-04 — End: 2016-11-05

## 2016-11-04 MED ORDER — EPHEDRINE SULFATE 50 MG/ML IJ SOLN
INTRAMUSCULAR | Status: AC
  Filled 2016-11-04: qty 1

## 2016-11-04 MED ORDER — ONDANSETRON HCL 4 MG/2ML IJ SOLN
INTRAMUSCULAR | Status: AC
  Filled 2016-11-04: qty 2

## 2016-11-04 MED ORDER — ROCURONIUM BROMIDE 100 MG/10ML IV SOLN
INTRAVENOUS | Status: DC | PRN
  Administered 2016-11-04: 5 mg via INTRAVENOUS
  Administered 2016-11-04: 10 mg via INTRAVENOUS
  Administered 2016-11-04: 35 mg via INTRAVENOUS
  Administered 2016-11-04: 50 mg via INTRAVENOUS

## 2016-11-04 MED ORDER — PIPERACILLIN-TAZOBACTAM 3.375 G IVPB
3.3750 g | Freq: Three times a day (TID) | INTRAVENOUS | Status: DC
  Administered 2016-11-04 – 2016-11-10 (×14): 3.375 g via INTRAVENOUS
  Filled 2016-11-04 (×15): qty 50

## 2016-11-04 MED ORDER — PHENYLEPHRINE HCL 10 MG/ML IJ SOLN
INTRAMUSCULAR | Status: AC
  Filled 2016-11-04: qty 1

## 2016-11-04 MED ORDER — LIDOCAINE HCL (PF) 2 % IJ SOLN
INTRAMUSCULAR | Status: AC
  Filled 2016-11-04: qty 2

## 2016-11-04 MED ORDER — FENTANYL CITRATE (PF) 100 MCG/2ML IJ SOLN
INTRAMUSCULAR | Status: DC | PRN
Start: 2016-11-04 — End: 2016-11-04
  Administered 2016-11-04 (×2): 25 ug via INTRAVENOUS
  Administered 2016-11-04: 50 ug via INTRAVENOUS

## 2016-11-04 SURGICAL SUPPLY — 49 items
APPLIER CLIP 11 MED OPEN (CLIP) ×4
APPLIER CLIP 13 LRG OPEN (CLIP)
BANDAGE GZE STRCH 3 X5YDS N/ST (GAUZE/BANDAGES/DRESSINGS) ×4 IMPLANT
BLADE CLIPPER SURG (BLADE) ×4 IMPLANT
BLADE SURG 15 STRL LF DISP TIS (BLADE) ×2 IMPLANT
BLADE SURG 15 STRL SS (BLADE) ×2
BULB RESERV EVAC DRAIN JP 100C (MISCELLANEOUS) ×4 IMPLANT
CANISTER SUCT 3000ML PPV (MISCELLANEOUS) ×4 IMPLANT
CHLORAPREP W/TINT 26ML (MISCELLANEOUS) ×4 IMPLANT
CLIP APPLIE 11 MED OPEN (CLIP) ×2 IMPLANT
CLIP APPLIE 13 LRG OPEN (CLIP) IMPLANT
DRAIN CHANNEL JP 19F (MISCELLANEOUS) ×4 IMPLANT
DRAPE LAPAROTOMY 100X77 ABD (DRAPES) ×4 IMPLANT
DRAPE TABLE BACK 80X90 (DRAPES) ×4 IMPLANT
DRSG TEGADERM 2-3/8X2-3/4 SM (GAUZE/BANDAGES/DRESSINGS) IMPLANT
DRSG TELFA 3X8 NADH (GAUZE/BANDAGES/DRESSINGS) IMPLANT
ELECT BLADE 6.5 EXT (BLADE) ×4 IMPLANT
ELECT REM PT RETURN 9FT ADLT (ELECTROSURGICAL) ×4
ELECTRODE REM PT RTRN 9FT ADLT (ELECTROSURGICAL) ×2 IMPLANT
GAUZE SPONGE 4X4 12PLY STRL (GAUZE/BANDAGES/DRESSINGS) ×4 IMPLANT
GLOVE BIO SURGEON STRL SZ7 (GLOVE) ×4 IMPLANT
GOWN STRL REUS W/ TWL LRG LVL3 (GOWN DISPOSABLE) ×4 IMPLANT
GOWN STRL REUS W/TWL LRG LVL3 (GOWN DISPOSABLE) ×4
HANDLE SUCTION POOLE (INSTRUMENTS) ×2 IMPLANT
HANDLE YANKAUER SUCT BULB TIP (MISCELLANEOUS) ×4 IMPLANT
LIGASURE IMPACT 36 18CM CVD LR (INSTRUMENTS) ×4 IMPLANT
NEEDLE HYPO 22GX1.5 SAFETY (NEEDLE) IMPLANT
NEEDLE HYPO 25X1 1.5 SAFETY (NEEDLE) IMPLANT
PACK BASIN MAJOR ARMC (MISCELLANEOUS) ×4 IMPLANT
PACK COLON CLEAN CLOSURE (MISCELLANEOUS) ×4 IMPLANT
RELOAD PROXIMATE 75MM BLUE (ENDOMECHANICALS) IMPLANT
SPONGE LAP 18X18 5 PK (GAUZE/BANDAGES/DRESSINGS) IMPLANT
SPONGE LAP 18X36 2PK (MISCELLANEOUS) IMPLANT
STAPLER PROXIMATE 75MM BLUE (STAPLE) ×4 IMPLANT
STAPLER SKIN PROX 35W (STAPLE) ×4 IMPLANT
SUCTION POOLE HANDLE (INSTRUMENTS) ×4
SUT PDS AB 1 TP1 96 (SUTURE) IMPLANT
SUT SILK 2 0 (SUTURE) ×2
SUT SILK 2 0 SH CR/8 (SUTURE) ×4 IMPLANT
SUT SILK 2 0SH CR/8 30 (SUTURE) ×4 IMPLANT
SUT SILK 2-0 18XBRD TIE 12 (SUTURE) ×2 IMPLANT
SUT VIC AB 0 CT1 36 (SUTURE) IMPLANT
SUT VIC AB 2-0 CT2 27 (SUTURE) ×4 IMPLANT
SUT VIC AB 2-0 SH 27 (SUTURE) ×2
SUT VIC AB 2-0 SH 27XBRD (SUTURE) ×2 IMPLANT
SYR 30ML LL (SYRINGE) IMPLANT
SYR 3ML LL SCALE MARK (SYRINGE) IMPLANT
TAPE MICROFOAM 4IN (TAPE) ×4 IMPLANT
TRAY FOLEY W/METER SILVER 16FR (SET/KITS/TRAYS/PACK) ×4 IMPLANT

## 2016-11-04 NOTE — Anesthesia Procedure Notes (Signed)
Procedure Name: Intubation Date/Time: 11/04/2016 3:44 PM Performed by: Silvana Newness Pre-anesthesia Checklist: Patient identified, Emergency Drugs available, Suction available, Patient being monitored and Timeout performed Patient Re-evaluated:Patient Re-evaluated prior to induction Oxygen Delivery Method: Circle system utilized Preoxygenation: Pre-oxygenation with 100% oxygen Induction Type: IV induction and Cricoid Pressure applied Ventilation: Mask ventilation without difficulty Laryngoscope Size: Mac and 4 Grade View: Grade II Tube type: Oral Tube size: 7.5 mm Number of attempts: 1 Airway Equipment and Method: Stylet Placement Confirmation: ETT inserted through vocal cords under direct vision,  positive ETCO2 and breath sounds checked- equal and bilateral Secured at: 21 cm Tube secured with: Tape Dental Injury: Teeth and Oropharynx as per pre-operative assessment

## 2016-11-04 NOTE — Progress Notes (Signed)
Pharmacy Antibiotic Note  Louis Lawson is a 83 y.o. male admitted on 11/04/2016 with sepsis.  Pharmacy has been consulted for vancomycin and piperacillin/tazobactam dosing.  Plan: Piperacillin/tazobactam 3.375 g IV q8h EI  Vancomycin 1000 mg given in ED.  Will order vancomycin 1000 mg IV q24h (6 hour stack) Goal VT 15-20 mcg/mL VT ordered 7/26 which will be prior to steady state, but want to ensure not supratherapeutic given patient's age.   Kinetics: Adjusted body weight = 85 kg, CrCl = 35 mL/min Ke: 0.033 Half-life: 21 hrs Vd: 59 L Cmin (est) ~15 mcg/mL  Height: 5\' 11"  (180.3 cm) Weight: 220 lb (99.8 kg) IBW/kg (Calculated) : 75.3  Temp (24hrs), Avg:103 F (39.4 C), Min:103 F (39.4 C), Max:103 F (39.4 C)   Recent Labs Lab 11/04/16 1040 11/04/16 1121  WBC  --  15.5*  CREATININE  --  1.62*  LATICACIDVEN 2.6*  --     Estimated Creatinine Clearance: 35 mL/min (A) (by C-G formula based on SCr of 1.62 mg/dL (H)).    No Known Allergies  Antimicrobials this admission: vancomycin 7/24 >>  Piperacillin/tazobactam 7/24 >>   Dose adjustments this admission:  Microbiology results: 7/24 BCx: Sent 7/24 UCx: Sent   Thank you for allowing pharmacy to be a part of this patient's care.  Cindi CarbonMary M Emalie Mcwethy, PharmD, BCPS Clinical Pharmacist 11/04/2016 1:13 PM

## 2016-11-04 NOTE — ED Notes (Addendum)
Bed battery for weighing dead, pt gives most recent weight.

## 2016-11-04 NOTE — ED Notes (Signed)
Pharmacy called and notified that there is no vanc or zosyn in pyxis. States they will send it.

## 2016-11-04 NOTE — Anesthesia Post-op Follow-up Note (Cosign Needed)
Anesthesia QCDR form completed.        

## 2016-11-04 NOTE — ED Provider Notes (Signed)
[facility] Emergency Department Provider Note   ____________________________________________    I have reviewed the triage vital signs and the nursing notes.   HISTORY  Chief Complaint Weakness  Patient is a poor historian  HPI Louis Lawson is a 83 y.o. male who presents with weakness as well as abdominal pain and fever.EMS reports that someone was trying to get the patient to the car to take him to the doctor today but the patient was too weak to ambulate. EMS reports fever and tachycardia. Patient reports he has some belly pain but reports normal stools. He denies cough. Denies dysuria. Symptoms started yesterday but got worse today. He has not taken anything for this. He has never had this before   Past Medical History:  Diagnosis Date  . Coronary artery disease   . Hypertension   . Irregular heart beat     There are no active problems to display for this patient.   Past Surgical History:  Procedure Laterality Date  . VASCULAR SURGERY Right    Lower Extremity- secondary to DVT    Prior to Admission medications   Medication Sig Start Date End Date Taking? Authorizing Provider  allopurinol (ZYLOPRIM) 300 MG tablet Take 1 tablet by mouth daily. 02/27/16   [provider]  amLODipine (NORVASC) 10 MG tablet Take 1 tablet by mouth daily. 02/27/16   [provider]  Cholecalciferol (VITAMIN D PO) Take by mouth.    [provider]  doxycycline (VIBRAMYCIN) 50 MG capsule Take 2 capsules (100 mg total) by mouth 2 (two) times daily. 03/28/16   Pabon, Diego F, MD  furosemide (LASIX) 40 MG tablet Take 40 mg by mouth.    [provider]  metoprolol succinate (TOPROL-XL) 50 MG 24 hr tablet Take 50 mg by mouth daily. Take with or immediately following a meal.    [provider]  ramipril (ALTACE) 10 MG capsule Take 10 mg by mouth daily.    [provider]  simvastatin (ZOCOR) 20 MG tablet Take 20 mg by  mouth daily.    [provider]     Allergies Patient has no known allergies.  Family History  Problem Relation Age of Onset  . Hypertension Sister     Social History Social History  Substance Use Topics  . Smoking status: Former Smoker    Quit date: 03/28/1964  . Smokeless tobacco: Never Used  . Alcohol use Yes     Comment: Occasional    Review of Systems  Constitutional: Positive fever Eyes: Denies visual changes.  ENT: Denies sore throat. Cardiovascular: Denies chest pain. Respiratory: Denies cough Gastrointestinal: As above, no vomiting Genitourinary: Negative for dysuria. Musculoskeletal: Negative for joint swelling Skin: Negative for rash. Neurological: Negative for headaches or weakness   ____________________________________________   PHYSICAL EXAM:  VITAL SIGNS: ED Triage Vitals  Enc Vitals Group     BP 11/04/16 1036 (!) 189/91     Pulse Rate 11/04/16 1036 (!) 113     Resp 11/04/16 1036 (!) 22     Temp 11/04/16 1036 (!) 103 F (39.4 C)     Temp Source 11/04/16 1036 Oral     SpO2 --      Weight 11/04/16 1037 99.8 kg (220 lb)     Height --      Head Circumference --      Peak Flow --      Pain Score --      Pain Loc --  Pain Edu? --      Excl. in GC? --     Constitutional: Alert and oriented.Ill-appearing Eyes: Conjunctivae are normal.  Head: Atraumatic. Nose: No congestion/rhinnorhea. Mouth/Throat: Mucous membranes are moist.   Neck:  Painless ROM Cardiovascular: Tachycardia, regular rhythm. Grossly normal heart sounds.  Good peripheral circulation. Respiratory: Normal respiratory effort.  No retractions. Lungs CTAB. Gastrointestinal: Distended abdomen, mild tender to palpation in all quadrants .  No CVA tenderness. Genitourinary: deferred Musculoskeletal: 2+ edema bilaterally lower extremities. Warm and well perfused Neurologic:  Normal speech and language. No gross focal neurologic deficits are appreciated.  Skin:  Skin is  warm, dry and intact. No rash noted. Psychiatric: Mood and affect are normal. Speech and behavior are normal.  ____________________________________________   LABS (all labs ordered are listed, but only abnormal results are displayed)  Labs Reviewed  CULTURE, BLOOD (ROUTINE X 2)  CULTURE, BLOOD (ROUTINE X 2)  URINE CULTURE  LACTIC ACID, PLASMA  LACTIC ACID, PLASMA  COMPREHENSIVE METABOLIC PANEL  CBC WITH DIFFERENTIAL/PLATELET  TROPONIN I  LIPASE, BLOOD  PROCALCITONIN  URINALYSIS, COMPLETE (UACMP) WITH MICROSCOPIC   ____________________________________________  EKG  ED ECG REPORT I, Jene Every, the attending physician, personally viewed and interpreted this ECG.  Date: 11/04/2016  Rate:113 Rhythm: Sinus tachycardia QRS Axis: normal Intervals: Left bundle branch block ST/T Wave abnormalities: normal   ____________________________________________  RADIOLOGY  CT shows small bowel inflammation and likely perforation ____________________________________________   PROCEDURES  Procedure(s) performed: No    Critical Care performed: yes  CRITICAL CARE Performed by: Jene Every   Total critical care time: 30 minutes  Critical care time was exclusive of separately billable procedures and treating other patients.  Critical care was necessary to treat or prevent imminent or life-threatening deterioration.  Critical care was time spent personally by me on the following activities: development of treatment plan with patient and/or surrogate as well as nursing, discussions with consultants, evaluation of patient's response to treatment, examination of patient, obtaining history from patient or surrogate, ordering and performing treatments and interventions, ordering and review of laboratory studies, ordering and review of radiographic studies, pulse oximetry and re-evaluation of patient's condition.  ____________________________________________   INITIAL  IMPRESSION / ASSESSMENT AND PLAN / ED COURSE  Pertinent labs & imaging results that were available during my care of the patient were reviewed by me and considered in my medical decision making (see chart for details).  Patient presents with primary complaint of abdominal pain and weakness. He is tachycardic and febrile, code sepsis initiated. Broad-spectrum antibiotics ordered.  CT scan demonstrates likely microperforation, probable cause of his sepsis. Discussed with Dr. Everlene Farrier of surgery who will see the patient in the ED.      ____________________________________________   FINAL CLINICAL IMPRESSION(S) / ED DIAGNOSES  Final diagnoses:  Small bowel perforation (HCC)  Enteritis  Sepsis, due to unspecified organism [facility])      NEW MEDICATIONS STARTED DURING THIS VISIT:  New Prescriptions   No medications on file     Note:  This document was prepared using Dragon voice recognition software and may include unintentional dictation errors.    Jene Every, MD 11/04/16 [phone]

## 2016-11-04 NOTE — Op Note (Signed)
PROCEDURES: 1. Laparotomy 2. Appendectomy 3. Placement # 19 FR blake drain 4. Umbilical hernia repair  Pre-operative Diagnosis: sepsis perforated viscus  Post-operative Diagnosis: perforated appendix  Surgeon: Merri Rayiego F Eldon Zietlow   Assistants: Dr. Thelma Bargeaks   Anesthesia: General endotracheal anesthesia  ASA Class: 3   Surgeon: Sterling Bigiego Erine Phenix , MD FACS  Anesthesia: Gen. with endotracheal tube  Findings: Perforated appendicitis [facility] Reactive inflammatory response Ileum  Ascitis   Estimated Blood Loss: 20cc         Specimens: appendix        Complications: none                Procedure Details  The patient was seen again in the Holding Room. The benefits, complications, treatment options, and expected outcomes were discussed with the patient. The risks of bleeding, infection, recurrence of symptoms, failure to resolve symptoms,  bowel injury, any of which could require further surgery were reviewed with the patient.   The patient was taken to Operating Room, identified as Louis Lawson and the procedure verified.  A Time Out was held and the above information confirmed.  Prior to the induction of general anesthesia, antibiotic prophylaxis was administered. VTE prophylaxis was in place. General endotracheal anesthesia was then administered and tolerated well. After the induction, the abdomen was prepped with Chloraprep and draped in the sterile fashion. The patient was positioned in the supine position.  Laparotomy performed with a 10 blade knife and electrocautery was used to dissect through the cutaneous tissue. The abdominal cavity was entered after incising the fascia and a generous incision was created. We immediately saw a foul-smelling ascites that we aspirated. There was significant inflammatory response around the mesentery of the terminal ileum and there was a necrotic and perforated appendix. We elevated the appendix and using the LigaSure device we divided the mesentery of the  appendix. Appendix was divided with GIA-75 stapler in standard fashion.  We ran the small bowel from the ligament of Treitz all the way to the ileocecal valve and there was no evidence of any perforation. There was some evidence of a small bowel diverticulosis at the level of the jejunum without evidence of diverticulitis of perforation. The NG tube was in place. We also note that there was significant dilated small bowel from an ileus caused by the perforated appendix.  We changed gloves and using a clean tray we closed the abdominal fascia also incorporating the umbilical hernia defect after excising the sac. We is a #1 PDS to close the fascia. Given the sepsis, H and contamination of the case I decided not to close the skin. The wound was irrigated and a wet to dry dressing was placed. After some of Marcaine was injected throughout the abdominal wall   Needle and laparotomy count were correct and there were no immediate complications Sterling Bigiego Nakeisha Greenhouse, MD, FACS

## 2016-11-04 NOTE — ED Notes (Signed)
Code Sepsis called at 1045

## 2016-11-04 NOTE — ED Triage Notes (Signed)
Pt presents to ER c/o abdominal pain X 2 days, weakness. Pt febrile. Tachycardic with EMS. CBG 147 with EMS. Pt alert and oriented X4, active, cooperative, pt in NAD. RR even and unlabored, color WNL.

## 2016-11-04 NOTE — Anesthesia Preprocedure Evaluation (Signed)
Anesthesia Evaluation  Patient identified by MRN, date of birth, ID band Patient awake    Reviewed: Allergy & Precautions, NPO status , Patient's Chart, lab work & pertinent test results, reviewed documented beta blocker date and time   Airway Mallampati: II  TM Distance: >3 FB     Dental   Pulmonary former smoker,    Pulmonary exam normal        Cardiovascular hypertension, Pt. on medications and Pt. on home beta blockers + CAD and + Peripheral Vascular Disease  Normal cardiovascular exam+ dysrhythmias      Neuro/Psych negative neurological ROS  negative psych ROS   GI/Hepatic Neg liver ROS, perforation   Endo/Other  negative endocrine ROS  Renal/GU negative Renal ROS  negative genitourinary   Musculoskeletal negative musculoskeletal ROS (+)   Abdominal Normal abdominal exam  (+)   Peds negative pediatric ROS (+)  Hematology negative hematology ROS (+)   Anesthesia Other Findings   Reproductive/Obstetrics                             Anesthesia Physical Anesthesia Plan  ASA: III and emergent  Anesthesia Plan: General   Post-op Pain Management:    Induction: Intravenous, Cricoid pressure planned and Rapid sequence  PONV Risk Score and Plan: 3 and Ondansetron, Dexamethasone, Propofol and Midazolam  Airway Management Planned: Oral ETT  Additional Equipment:   Intra-op Plan:   Post-operative Plan: Extubation in OR  Informed Consent: I have reviewed the patients History and Physical, chart, labs and discussed the procedure including the risks, benefits and alternatives for the proposed anesthesia with the patient or authorized representative who has indicated his/her understanding and acceptance.   Dental advisory given  Plan Discussed with: CRNA and Surgeon  Anesthesia Plan Comments:         Anesthesia Quick Evaluation

## 2016-11-04 NOTE — Consult Note (Signed)
Name: Louis Lawson MRN: [id number] DOB: [birth year]     CONSULTATION DATE: 11/04/2016  REFERRING MD :  Everlene FarrierPabon  CHIEF COMPLAINT: Respiratory failure  STUDIES:  Chest x-ray 724 I have Independently reviewed images of  CXR   on 11/04/2016 Interpretation: No acute process no acute pneumonia and no opacities noted    HISTORY OF PRESENT ILLNESS:   83 year old male admitted to the ICU postop for acute restaurant failure Patient was having abdominal symptoms abdominal pain nausea vomiting for the last several days Patient had acute abdomen and Gen. surgery to patient to the OR emergently and found to have a perforated appendix  Patient underwent emergent exploratory laparoscopy with hernia repair as well Patient admitted to the ICU postop restaurant failure Patient remains intubated not on any sedation Abdominal exam reveals distention along with bandages in place  Patient remains critically ill at this time  PAST MEDICAL HISTORY :   has a past medical history of Coronary artery disease; Hypertension; and Irregular heart beat.  has a past surgical history that includes Vascular surgery (Right). Prior to Admission medications   Medication Sig Start Date End Date Taking? Authorizing Provider  allopurinol (ZYLOPRIM) 300 MG tablet Take 1 tablet by mouth daily. 02/27/16  Yes [provider]  amLODipine (NORVASC) 10 MG tablet Take 1 tablet by mouth daily. 02/27/16  Yes [provider]  cholecalciferol (VITAMIN D) 1000 units tablet Take 1,000 Units by mouth daily.    Yes [provider]  furosemide (LASIX) 40 MG tablet Take 40 mg by mouth daily.    Yes [provider]  metoprolol succinate (TOPROL-XL) 50 MG 24 hr tablet Take 50 mg by mouth daily. Take with or immediately following a meal.   Yes [provider]  ramipril (ALTACE) 10 MG capsule Take 10 mg by mouth 2 (two) times daily.    Yes [provider]  simvastatin (ZOCOR) 20 MG tablet  Take 20 mg by mouth daily.   Yes [provider]   No Known Allergies  FAMILY HISTORY:  family history includes Hypertension in his sister. SOCIAL HISTORY:  reports that he quit smoking about 52 years ago. He has never used smokeless tobacco. He reports that he drinks alcohol. He reports that he does not use drugs.  REVIEW OF SYSTEMS:   Unable to obtain due to critical illness  ALL OTHER ROS ARE NEGATIVE    VITAL SIGNS: Temp:  [102 F (38.9 C)-103 F (39.4 C)] 102 F (38.9 C) (07/24 1721) Pulse Rate:  [97-115] 97 (07/24 1721) Resp:  [21-35] 22 (07/24 1330) BP: (136-190)/(74-104) 156/74 (07/24 1721) SpO2:  [94 %-100 %] 100 % (07/24 1721) Weight:  [220 lb (99.8 kg)] 220 lb (99.8 kg) (07/24 1037)  Physical Examination:  GENERAL:critically ill appearing, +resp distress HEAD: Normocephalic, atraumatic.  EYES: Pupils equal, round, reactive to light.  No scleral icterus.  MOUTH: Moist mucosal membrane. NECK: Supple. No thyromegaly. No nodules. No JVD.  PULMONARY: +rhonchi, +wheezing CARDIOVASCULAR: S1 and S2. Regular rate and rhythm. No murmurs, rubs, or gallops.  GASTROINTESTINAL: Soft, nontender, -distended. No masses. Positive bowel sounds. No hepatosplenomegaly.  MUSCULOSKELETAL: No swelling, clubbing, or edema.  NEUROLOGIC: obtunded SKIN:intact,warm,dry        Recent Labs Lab 11/04/16 1121  NA 135  K 4.0  CL 99*  CO2 26  BUN 23*  CREATININE 1.62*  GLUCOSE 137*    Recent Labs Lab 11/04/16 1121  HGB 13.4  HCT 39.8*  WBC 15.5*  PLT 165  ASSESSMENT / PLAN: 83 year old male admitted to the ICU for postop respiratory failure likely from acidosis and shock related to perforated appendix with acute abdomen  #1 continue ventilatory support #2 wean oxygen as needed #3 sedation as needed #4 vasopressors as needed to keep map above 65 Continue ICU monitoring DVT and GI prophylaxis ordered   Critical Care Time devoted to patient care services  described in this note is 55 minutes.   Overall, patient is critically ill, prognosis is guarded.  Patient with Multiorgan failure and at high risk for cardiac arrest and death.    Lucie Leather, M.D.  [facility]  Medical Director [facility] Medical Director [facility] Cardio-Pulmonary Department

## 2016-11-04 NOTE — Progress Notes (Signed)
Pt currently on spont mode 7/5 45% without complications.

## 2016-11-04 NOTE — Progress Notes (Signed)
Pt extubated without complications. Pt on room air Sao2 96%. Bs bilateral and equal, clear/diminshed. No stridor or wheezes. No signs of respiratory distress noted at this time.

## 2016-11-04 NOTE — ED Notes (Signed)
consent for surgery has been signed.

## 2016-11-04 NOTE — Progress Notes (Signed)
Kasa MD assessed the patient. Verbal orders to not start sedation. Attempt to wake the patient to assess if extubation is possible.

## 2016-11-04 NOTE — Transfer of Care (Signed)
Immediate Anesthesia Transfer of Care Note  Patient: Louis Lawson  Procedure(s) Performed: Procedure(s): EXPLORATORY LAPAROTOMY (N/A) APPENDECTOMY (N/A) HERNIA REPAIR UMBILICAL ADULT (N/A)  Patient Location: PACU  Anesthesia Type:General  Level of Consciousness: sedated and patient cooperative  Airway & Oxygen Therapy: Patient remains intubated per anesthesia plan  Post-op Assessment: Report given to RN and Post -op Vital signs reviewed and stable  Post vital signs: Reviewed and stable  Last Vitals:  Vitals:   11/04/16 1430 11/04/16 1721  BP: (!) 155/84 (!) 156/74  Pulse:  97  Resp:    Temp:  (!) 38.9 C    Last Pain:  Vitals:   11/04/16 1036  TempSrc: Oral         Complications: No apparent anesthesia complications

## 2016-11-04 NOTE — ED Notes (Signed)
X-ray at bedside

## 2016-11-04 NOTE — H&P (Signed)
Patient ID: Louis Lawson, male   DOB: 08/14/1922, 83 y.o.   MRN: [id number]  HPI Louis Lawson is a 83 y.o. male asked to see in consultation by Dr. Kinner. H/P taken from the daughter as pt is confused. Two day ago started having diffuse abdominal pain, N/V. Pain turned severe today and pt was brought via EMS, found tachy and febrile. Apparently he walks with a cane and has no signs of dementia. He pays his own bills and lives with his wife. Hx of CAD but no evidence of angina or dyspnea. Stent placed 10 years ago. CT scan pers reviewed evidence of thick SB w fluid and some extraluminal air.  Most of the history is taken from the family as pt is confused Wbc 16 and lactate is elevated as well as creat. ekg p. Reviewed sinus tachy LBBB  HPI  Past Medical History:  Diagnosis Date  . Coronary artery disease   . Hypertension   . Irregular heart beat     Past Surgical History:  Procedure Laterality Date  . VASCULAR SURGERY Right    Lower Extremity- secondary to DVT    Family History  Problem Relation Age of Onset  . Hypertension Sister     Social History Social History  Substance Use Topics  . Smoking status: Former Smoker    Quit date: 03/28/1964  . Smokeless tobacco: Never Used  . Alcohol use Yes     Comment: Occasional    No Known Allergies  Current Facility-Administered Medications  Medication Dose Route Frequency Provider Last Rate Last Dose  . 0.9 %  sodium chloride infusion  1,000 mL Intravenous Once Kinner, Robert, MD      . 0.9 %  sodium chloride infusion  1,000 mL Intravenous Once Kinner, Robert, MD      . piperacillin-tazobactam (ZOSYN) IVPB 3.375 g  3.375 g Intravenous Q8H Swayne, Mary M, RPH      . sodium chloride 0.9 % bolus 1,000 mL  1,000 mL Intravenous Once Pabon, Diego F, MD      . vancomycin (VANCOCIN) IVPB 1000 mg/200 mL premix  1,000 mg Intravenous Q24H Swayne, Mary M, RPH       Current Outpatient Prescriptions  Medication Sig Dispense Refill  .  allopurinol (ZYLOPRIM) 300 MG tablet Take 1 tablet by mouth daily.    . amLODipine (NORVASC) 10 MG tablet Take 1 tablet by mouth daily.    . cholecalciferol (VITAMIN D) 1000 units tablet Take 1,000 Units by mouth daily.     . furosemide (LASIX) 40 MG tablet Take 40 mg by mouth daily.     . metoprolol succinate (TOPROL-XL) 50 MG 24 hr tablet Take 50 mg by mouth daily. Take with or immediately following a meal.    . ramipril (ALTACE) 10 MG capsule Take 10 mg by mouth 2 (two) times daily.     . simvastatin (ZOCOR) 20 MG tablet Take 20 mg by mouth daily.       Review of Systems Full ROS  was asked  To the family and was negative except for the information on the HPI  Physical Exam Blood pressure (!) 190/96, pulse (!) 115, temperature (!) 103 F (39.4 C), temperature source Oral, resp. rate (!) 35, height 5' 11" (1.803 m), weight 99.8 kg (220 lb), SpO2 95 %. CONSTITUTIONAL: Tachycardic and confused EYES: Pupils are equal, round, and reactive to light, Sclera are non-icteric. EARS, NOSE, MOUTH AND THROAT: The oropharynx is clear. The oral mucosa is pink and dry.   Hearing is intact to voice. LYMPH NODES:  Lymph nodes in the neck are normal. RESPIRATORY:  Lungs are clear. There is normal respiratory effort, with equal breath sounds bilaterally, and without pathologic use of accessory muscles. CARDIOVASCULAR: Heart is regular without murmurs, gallops, or rubs Sinus tachy. GI: The abdomen is  Distended with rebound, + rovsing and obvious peritoneal signs. Reducible [facility]. GU: Rectal deferred.   MUSCULOSKELETAL: Normal muscle strength and tone. No cyanosis or edema.   SKIN: Turgor is good and there are no pathologic skin lesions or ulcers. NEUROLOGIC: Motor and sensation is grossly normal. Cranial nerves are grossly intact. PSYCH:  He is disoriented to place and time, follows commands  Data Reviewed I have personally reviewed the patient's imaging, laboratory findings and medical records.     Assessment/Plan  83 yo male reasonably functional with an acute abdomen and sepsis. Likely form perforated ulcer vs sb diverticulitis vs ischemic bowel. D/W family in detail about the gravity of the situation. Options of aggressive intervention to include emergent laparotomy vs medical rx vs hospice care. I provided extensive counseling to the family and explained the risks, benefits and possible complication of each treatment to include but not limited to resp failure, bleeding, infection and death. EVEN IF WE PROCEED W LAPAROTOMY HE IS AT INCREASE RISKS FOR MORBIDITY AND MORTALITY. They understand this and have decided  To proceed with laparotomy despite all potential fatal risks. This is obviously an emergency care, we will notify the OR and expedite his care. He is currently full code We have started aggressive fluid resusc and A/B rx .     Diego Pabon, MD FACS General Surgeon 11/04/2016, 2:02 PM   

## 2016-11-04 NOTE — Anesthesia Procedure Notes (Signed)
Arterial Line Insertion Start/End7/24/2018 3:55 PM, 11/04/2016 4:02 PM  Patient location: OR. Preanesthetic checklist: patient identified, IV checked, site marked, risks and benefits discussed, surgical consent, monitors and equipment checked, pre-op evaluation, timeout performed and anesthesia consent radial was placed Catheter size: 20 Fr Hand hygiene performed  and maximum sterile barriers used   Attempts: 2 Procedure performed without using ultrasound guided technique. Following insertion, dressing applied. Post procedure assessment: normal and unchanged

## 2016-11-04 NOTE — Consult Note (Signed)
Patient ID: Louis Lawson, male   DOB: 06-01-1922, 83 y.o.   MRN: [id number]  HPI Louis Lawson is a 83 y.o. male asked to see in consultation by Dr. Cyril Loosen. H/P taken from the daughter as pt is confused. Two day ago started having diffuse abdominal pain, N/V. Pain turned severe today and pt was brought via EMS, found tachy and febrile. Apparently he walks with a cane and has no signs of dementia. He pays his own bills and lives with his wife. Hx of CAD but no evidence of angina or dyspnea. Stent placed 10 years ago. CT scan pers reviewed evidence of thick SB w fluid and some extraluminal air.  Most of the history is taken from the family as pt is confused Wbc 16 and lactate is elevated as well as creat. ekg p. Reviewed sinus tachy LBBB  HPI  Past Medical History:  Diagnosis Date  . Coronary artery disease   . Hypertension   . Irregular heart beat     Past Surgical History:  Procedure Laterality Date  . VASCULAR SURGERY Right    Lower Extremity- secondary to DVT    Family History  Problem Relation Age of Onset  . Hypertension Sister     Social History Social History  Substance Use Topics  . Smoking status: Former Smoker    Quit date: 03/28/1964  . Smokeless tobacco: Never Used  . Alcohol use Yes     Comment: Occasional    No Known Allergies  Current Facility-Administered Medications  Medication Dose Route Frequency Provider Last Rate Last Dose  . 0.9 %  sodium chloride infusion  1,000 mL Intravenous Once Jene Every, MD      . 0.9 %  sodium chloride infusion  1,000 mL Intravenous Once Jene Every, MD      . piperacillin-tazobactam (ZOSYN) IVPB 3.375 g  3.375 g Intravenous Q8H Swayne, Mary M, RPH      . sodium chloride 0.9 % bolus 1,000 mL  1,000 mL Intravenous Once Pabon, [place] F, MD      . vancomycin (VANCOCIN) IVPB 1000 mg/200 mL premix  1,000 mg Intravenous Q24H Cindi Carbon, [facility]       Current Outpatient Prescriptions  Medication Sig Dispense Refill  .  allopurinol (ZYLOPRIM) 300 MG tablet Take 1 tablet by mouth daily.    Marland Kitchen amLODipine (NORVASC) 10 MG tablet Take 1 tablet by mouth daily.    . cholecalciferol (VITAMIN D) 1000 units tablet Take 1,000 Units by mouth daily.     . furosemide (LASIX) 40 MG tablet Take 40 mg by mouth daily.     . metoprolol succinate (TOPROL-XL) 50 MG 24 hr tablet Take 50 mg by mouth daily. Take with or immediately following a meal.    . ramipril (ALTACE) 10 MG capsule Take 10 mg by mouth 2 (two) times daily.     . simvastatin (ZOCOR) 20 MG tablet Take 20 mg by mouth daily.       Review of Systems Full ROS  was asked  To the family and was negative except for the information on the HPI  Physical Exam Blood pressure (!) 190/96, pulse (!) 115, temperature (!) 103 F (39.4 C), temperature source Oral, resp. rate (!) 35, height 5\' 11"  (1.803 m), weight 99.8 kg (220 lb), SpO2 95 %. CONSTITUTIONAL: Tachycardic and confused EYES: Pupils are equal, round, and reactive to light, Sclera are non-icteric. EARS, NOSE, MOUTH AND THROAT: The oropharynx is clear. The oral mucosa is pink and dry.  Hearing is intact to voice. LYMPH NODES:  Lymph nodes in the neck are normal. RESPIRATORY:  Lungs are clear. There is normal respiratory effort, with equal breath sounds bilaterally, and without pathologic use of accessory muscles. CARDIOVASCULAR: Heart is regular without murmurs, gallops, or rubs Sinus tachy. GI: The abdomen is  Distended with rebound, + rovsing and obvious peritoneal signs. Reducible UH. GU: Rectal deferred.   MUSCULOSKELETAL: Normal muscle strength and tone. No cyanosis or edema.   SKIN: Turgor is good and there are no pathologic skin lesions or ulcers. NEUROLOGIC: Motor and sensation is grossly normal. Cranial nerves are grossly intact. PSYCH:  He is disoriented to place and time, follows commands  Data Reviewed I have personally reviewed the patient's imaging, laboratory findings and medical records.     Assessment/Plan  83 yo male reasonably functional with an acute abdomen and sepsis. Likely form perforated ulcer vs sb diverticulitis vs ischemic bowel. D/W family in detail about the gravity of the situation. Options of aggressive intervention to include emergent laparotomy vs medical rx vs hospice care. I provided extensive counseling to the family and explained the risks, benefits and possible complication of each treatment to include but not limited to resp failure, bleeding, infection and death. EVEN IF WE PROCEED W LAPAROTOMY HE IS AT INCREASE RISKS FOR MORBIDITY AND MORTALITY. They understand this and have decided  To proceed with laparotomy despite all potential fatal risks. This is obviously an emergency care, we will notify the OR and expedite his care. He is currently full code We have started aggressive fluid resusc and A/B rx .     Sterling Bigiego Pabon, MD FACS General Surgeon 11/04/2016, 2:02 PM

## 2016-11-05 ENCOUNTER — Encounter: Payer: Self-pay | Admitting: Surgery

## 2016-11-05 ENCOUNTER — Inpatient Hospital Stay: Payer: Medicare HMO

## 2016-11-05 LAB — COMPREHENSIVE METABOLIC PANEL
ALBUMIN: 2.9 g/dL — AB (ref 3.5–5.0)
ALT: 13 U/L — AB (ref 17–63)
AST: 28 U/L (ref 15–41)
Alkaline Phosphatase: 31 U/L — ABNORMAL LOW (ref 38–126)
Anion gap: 7 (ref 5–15)
BILIRUBIN TOTAL: 1.9 mg/dL — AB (ref 0.3–1.2)
BUN: 23 mg/dL — AB (ref 6–20)
CHLORIDE: 104 mmol/L (ref 101–111)
CO2: 26 mmol/L (ref 22–32)
CREATININE: 1.27 mg/dL — AB (ref 0.61–1.24)
Calcium: 8.1 mg/dL — ABNORMAL LOW (ref 8.9–10.3)
GFR calc Af Amer: 55 mL/min — ABNORMAL LOW (ref >60)
GFR calc non Af Amer: 47 mL/min — ABNORMAL LOW (ref >60)
GLUCOSE: 159 mg/dL — AB (ref 65–99)
POTASSIUM: 3.8 mmol/L (ref 3.5–5.1)
Sodium: 137 mmol/L (ref 135–145)
Total Protein: 6.8 g/dL (ref 6.5–8.1)

## 2016-11-05 LAB — CBC
HEMATOCRIT: 40.5 % (ref 40.0–52.0)
HEMOGLOBIN: 13.6 g/dL (ref 13.0–18.0)
MCH: 31.9 pg (ref 26.0–34.0)
MCHC: 33.5 g/dL (ref 32.0–36.0)
MCV: 95.1 fL (ref 80.0–100.0)
Platelets: 141 10*3/uL — ABNORMAL LOW (ref 150–440)
RBC: 4.26 MIL/uL — AB (ref 4.40–5.90)
RDW: 15.4 % — ABNORMAL HIGH (ref 11.5–14.5)
WBC: 12.5 10*3/uL — ABNORMAL HIGH (ref 3.8–10.6)

## 2016-11-05 LAB — URINE CULTURE: Culture: <10000 — AB

## 2016-11-05 LAB — PHOSPHORUS: Phosphorus: 2.7 mg/dL (ref 2.5–4.6)

## 2016-11-05 LAB — APTT: aPTT: 42 seconds — ABNORMAL HIGH (ref 24–36)

## 2016-11-05 LAB — MAGNESIUM: Magnesium: 1.3 mg/dL — ABNORMAL LOW (ref 1.7–2.4)

## 2016-11-05 LAB — GLUCOSE, CAPILLARY: Glucose-Capillary: 116 mg/dL — ABNORMAL HIGH (ref 65–99)

## 2016-11-05 MED ORDER — ALLOPURINOL 300 MG PO TABS: 300.0000 mg | ORAL_TABLET | Freq: Every day | ORAL | Status: DC

## 2016-11-05 MED ORDER — SIMVASTATIN 20 MG PO TABS: 20.0000 mg | ORAL_TABLET | Freq: Every day | ORAL | Status: DC

## 2016-11-05 MED ORDER — AMLODIPINE BESYLATE 10 MG PO TABS: 10.0000 mg | ORAL_TABLET | Freq: Every day | ORAL | Status: DC

## 2016-11-05 MED ORDER — HYDRALAZINE HCL 20 MG/ML IJ SOLN: 10.0000 mg | INTRAMUSCULAR | Status: DC | PRN

## 2016-11-05 MED ORDER — ONDANSETRON HCL 4 MG/2ML IJ SOLN: 4.0000 mg | Freq: Once | INTRAMUSCULAR | Status: DC | PRN

## 2016-11-05 MED ORDER — FUROSEMIDE 20 MG PO TABS: 40.0000 mg | ORAL_TABLET | Freq: Every day | ORAL | Status: DC

## 2016-11-05 MED ORDER — MAGNESIUM SULFATE 4 GM/100ML IV SOLN
4.0000 g | Freq: Once | INTRAVENOUS | Status: AC
  Administered 2016-11-05: 4 g via INTRAVENOUS
  Filled 2016-11-05: qty 100

## 2016-11-05 MED ORDER — FENTANYL CITRATE (PF) 100 MCG/2ML IJ SOLN: 25.0000 ug | INTRAMUSCULAR | Status: DC | PRN

## 2016-11-05 MED ORDER — VANCOMYCIN HCL IN DEXTROSE 1-5 GM/200ML-% IV SOLN
1000.0000 mg | INTRAVENOUS | Status: DC
  Filled 2016-11-05: qty 200

## 2016-11-05 MED ORDER — RAMIPRIL 10 MG PO CAPS
10.0000 mg | ORAL_CAPSULE | Freq: Two times a day (BID) | ORAL | Status: DC
  Filled 2016-11-05: qty 1

## 2016-11-05 MED ORDER — METOPROLOL SUCCINATE ER 50 MG PO TB24
50.0000 mg | ORAL_TABLET | Freq: Every day | ORAL | Status: DC
Start: 2016-11-05 — End: 2016-11-11
  Administered 2016-11-07 – 2016-11-11 (×5): 50 mg via ORAL
  Filled 2016-11-05 (×6): qty 1

## 2016-11-05 NOTE — Progress Notes (Signed)
Report called to Leotis ShamesLauren, RN on 2C. Patient transported on portable telemetry via hospital bed to room 202 by Chasity, NT.

## 2016-11-05 NOTE — Progress Notes (Signed)
PHARMACY CONSULT NOTE - INITIAL   Pharmacy Consult for Electrolyte Monitoring and Replacement  No Known Allergies  Patient Measurements: Height: 5\' 11"  (180.3 cm) Weight: 234 lb 9.1 oz (106.4 kg) IBW/kg (Calculated) : 75.3   Vital Signs: Temp: 98.4 F (36.9 C) (07/25 0400) Temp Source: Axillary (07/25 0000) BP: 169/88 (07/25 1116) Pulse Rate: 82 (07/25 1116) Intake/Output from previous day: 07/24 0701 - 07/25 0700 In: 3155 [I.V.:2855; IV Piggyback:300] Out: 1115 [Urine:785; Drains:115; Blood:15] Intake/Output from this shift: No intake/output data recorded.  Labs:  Recent Labs  11/04/16 1121 11/04/16 1804 11/05/16 0805  WBC 15.5* 7.2 12.5*  HGB 13.4 15.0 13.6  HCT 39.8* 45.0 40.5  PLT 165 156 141*  APTT  --   --  42*  CREATININE 1.62* 1.55* 1.27*  MG  --  1.1* 1.3*  PHOS  --  3.2 2.7  ALBUMIN 3.8 3.1* 2.9*  PROT 8.1 6.9 6.8  AST 29 35 28  ALT 14* 14* 13*  ALKPHOS 47 41 31*  BILITOT 2.0* 1.9* 1.9*   Lab Results  Component Value Date   K 3.8 11/05/2016    Estimated Creatinine Clearance: 46 mL/min (A) (by C-G formula based on SCr of 1.27 mg/dL (H)).    Assessment: 83 yo male admitted with perforated appendix. Pharmacy has been consulted for electrolyte monitoring and replacement.   Goal of Therapy:  Electrolytes WNL   Plan:  K+: 3.8, Mg: 1.3, Phos: 2.7  Will replace with Magnesium 4g IV x 1 dose. No other replacement needed at this time. Will order F/U labs in AM and continue to replace as needed.   Gardner CandleSheema M Severina Sykora, PharmD, BCPS Clinical Pharmacist 11/05/2016 11:53 AM

## 2016-11-05 NOTE — Anesthesia Postprocedure Evaluation (Signed)
Anesthesia Post Note  Patient: Mickey Farberlton [place]burn  Procedure(s) Performed: Procedure(s) (LRB): EXPLORATORY LAPAROTOMY (N/A) APPENDECTOMY (N/A) HERNIA REPAIR UMBILICAL ADULT (N/A)  Patient location during evaluation: ICU Anesthesia Type: General Level of consciousness: sedated Pain management: pain level controlled Vital Signs Assessment: post-procedure vital signs reviewed and stable Respiratory status: patient remains intubated per anesthesia plan Cardiovascular status: stable Anesthetic complications: no     Last Vitals:  Vitals:   11/05/16 0615 11/05/16 0700  BP: (!) 149/75 (!) 153/83  Pulse: 81 82  Resp: 18 17  Temp:      Last Pain:  Vitals:   11/05/16 0400  TempSrc:   PainSc: 0-No pain                 Starling Mannsurtis,  Marsheila Alejo A

## 2016-11-05 NOTE — Progress Notes (Addendum)
CC: POD #1 Subjective: Doing very well considering injury. Extubated w/o issues Awake, alert and oriented t today  Objective: Vital signs in last 24 hours: Temp:  [98.1 F (36.7 C)-103 F (39.4 C)] 98.4 F (36.9 C) (07/25 0400) Pulse Rate:  [81-115] 82 (07/25 0700) Resp:  [10-35] 17 (07/25 0700) BP: (117-190)/(71-104) 153/83 (07/25 0700) SpO2:  [91 %-100 %] 91 % (07/25 0700) Arterial Line BP: (86-163)/(62-117) 114/107 (07/25 0615) FiO2 (%):  [30 %-45 %] 45 % (07/24 2047) Weight:  [99.8 kg (220 lb)-106.4 kg (234 lb 9.1 oz)] 106.4 kg (234 lb 9.1 oz) (07/25 0433) Last BM Date:  (UTA)  Intake/Output from previous day: 07/24 0701 - 07/25 0700 In: 3155 [I.V.:2855; IV Piggyback:300] Out: 1115 [Urine:785; Drains:115; Blood:15] Intake/Output this shift: No intake/output data recorded.  Physical exam: NAD Abd: soft, dressing in place. JP serous. No peritonitis Ext: some baseline edema and venous stasis  Lab Results: CBC   Recent Labs  11/04/16 1804 11/05/16 0805  WBC 7.2 12.5*  HGB 15.0 13.6  HCT 45.0 40.5  PLT 156 141*   BMET  Recent Labs  11/04/16 1804 11/05/16 0805  NA 134* 137  K 3.4* 3.8  CL 101 104  CO2 23 26  GLUCOSE 148* 159*  BUN 22* 23*  CREATININE 1.55* 1.27*  CALCIUM 8.1* 8.1*   PT/INR No results for input(s): LABPROT, INR in the last 72 hours. ABG  Recent Labs  11/04/16 1707  PHART 7.34*  HCO3 24.3    Studies/Results: Dg Abdomen 1 View  Result Date: 11/04/2016 CLINICAL DATA:  Abdominal pain. EXAM: ABDOMEN - 1 VIEW COMPARISON:  No recent . FINDINGS: Distended loops of bowel noted in the mid abdomen possibly small bowel. No colonic distention. Partial small-bowel obstruction cannot be excluded. No free air. IMPRESSION: Distended loops of small bowel with normal colonic gas pattern. Partial small-bowel obstruction cannot be excluded. Follow-up exam suggested. Electronically Signed   By: Maisie Fushomas  Register   On: 11/04/2016 11:16   Ct Abdomen  Pelvis W Contrast  Result Date: 11/04/2016 CLINICAL DATA:  Abdominal pain for 2 days weakness, fever, tachycardia, sepsis, history hypertension, coronary artery disease, former smoker EXAM: CT ABDOMEN AND PELVIS WITH CONTRAST TECHNIQUE: Multidetector CT imaging of the abdomen and pelvis was performed using the standard protocol following bolus administration of intravenous contrast. Sagittal and coronal MPR images reconstructed from axial data set. CONTRAST:  75mL ISOVUE-300 IOPAMIDOL (ISOVUE-300) INJECTION 61% IV. Dilute oral contrast. COMPARISON:  None FINDINGS: Lower chest: Minimal RIGHT basilar atelectasis. Questionable 4 mm lingular nodule image 2. Lung bases otherwise clear. Hepatobiliary: Depending calculi in gallbladder up to 12 mm diameter. No gallbladder wall thickening or biliary dilatation. Liver unremarkable. Pancreas: Normal appearance Spleen: Normal appearance Adrenals/Urinary Tract: LEFT adrenal nodule 18 x 12 mm question tiny adenoma RIGHT adrenal gland unremarkable. Two RIGHT renal cysts measuring 6.0 x 4.8 cm at inferior pole and 4.5 x 4.4 cm at upper pole. No urinary tract calcification, hydronephrosis, ureteral dilatation, or additional renal mass lesion. Minimal bladder wall thickening which could be related to chronic outlet obstruction in patient with mild prostatic enlargement, gland 5.3 x 3.8 cm image 86. Stomach/Bowel: Short segment of normal proximal appendix visualized, distally not well localized. Stomach decompressed. Colon unremarkable. Multiple dilated small bowel loops in mid abdomen with significant infiltrative/inflammatory changes in the small bowel mesenteries. A few tiny foci of gas are identified adjacent to thickened small bowel loops within this collection likely representing foci of extra intestinal gas and reflecting localized  small bowel perforation, doubt tiny small bowel diverticulum with none additional seen elsewhere. No definite abscess collection.  Vascular/Lymphatic: Atherosclerotic calcifications aorta with minimal aneurysmal dilatation of the distal abdominal aorta to a greatest size of 3.0 x 2.6 cm. Additional atherosclerotic calcifications in the iliac and femoral arteries as well as coronary arteries. No adenopathy. Reproductive: N/A Other: Tiny umbilical hernia containing fat and fluid. No free intraperitoneal air. Small amount of free fluid in pelvis. Musculoskeletal: Discogenic sclerosis at L4-L5. Scattered degenerative disc and facet disease changes lumbar spine. Degenerative changes of the hips. Fatty atrophy of the paraspinal muscles. IMPRESSION: Small bowel inflammatory process in the mid abdomen could reflect a severe enteritis with several tiny foci of suspected extraluminal gas adjacent to small bowel loops in this region, highly suspicious for localized perforation either on the basis of enteritis or foreign body perforation. Minimal free intraperitoneal fluid without free air. Some of the observed small bowel loops are mildly dilated which could be related to regional ileus or mild degree of obstruction. Minimal aneurysmal dilatation of the distal abdominal aorta 3.0 x 2.6 cm. Cholelithiasis. RIGHT renal cysts. Aortic aneurysm NOS (ICD10-I71.9). Aortic Atherosclerosis (ICD10-I70.0). Findings called to Dr. Cyril LoosenKinner on 11/04/2016 at 1318 hrs. Electronically Signed   By: Ulyses SouthwardMark  Boles M.D.   On: 11/04/2016 13:19   Dg Chest Port 1 View  Result Date: 11/05/2016 CLINICAL DATA:  Respiratory failure . EXAM: PORTABLE CHEST 1 VIEW COMPARISON:  11/04/2016 . FINDINGS: Endotracheal tube tip noted approximately 6 cm above the carina. Heart size stable. Persistent bibasilar atelectasis. Right base infiltrate cannot be excluded. Tiny right pleural effusion. No pneumothorax. IMPRESSION: 1. Endotracheal tube tip noted approximately 6 cm above the carina. 2. Persistent unchanged bibasilar atelectasis. Right base infiltrate cannot be excluded . Electronically  Signed   By: Maisie Fushomas  Register   On: 11/05/2016 06:50   Dg Chest Port 1 View  Result Date: 11/04/2016 CLINICAL DATA:  Perforated appendix, acute respiratory failure EXAM: PORTABLE CHEST 1 VIEW COMPARISON:  11/04/2016 FINDINGS: Interim placement of endotracheal tube, the tip is about 4.5 cm superior to the carina. Minimal basilar atelectasis. Stable enlarged cardiomediastinal with atherosclerosis. No consolidation or effusion. No pneumothorax. IMPRESSION: 1. Endotracheal tube tip about 4.5 cm superior to carina 2. Stable cardiomegaly 3. Minimal basilar atelectasis Electronically Signed   By: Jasmine PangKim  Fujinaga M.D.   On: 11/04/2016 18:54   Dg Chest Port 1 View  Result Date: 11/04/2016 CLINICAL DATA:  Abdominal pain for 2 days.  Weakness, fever EXAM: PORTABLE CHEST 1 VIEW COMPARISON:  04/07/2015 FINDINGS: Heart is borderline in size. Lungs are clear. No effusions or acute bony abnormality. IMPRESSION: No active disease. Electronically Signed   By: Charlett NoseKevin  Dover M.D.   On: 11/04/2016 11:16    Anti-infectives: Anti-infectives    Start     Dose/Rate Route Frequency Ordered Stop   11/05/16 1000  vancomycin (VANCOCIN) IVPB 1000 mg/200 mL premix     1,000 mg 200 mL/hr over 60 Minutes Intravenous Every 18 hours 11/05/16 0933     11/04/16 2000  piperacillin-tazobactam (ZOSYN) IVPB 3.375 g     3.375 g 12.5 mL/hr over 240 Minutes Intravenous Every 8 hours 11/04/16 1308     11/04/16 1700  vancomycin (VANCOCIN) IVPB 1000 mg/200 mL premix  Status:  Discontinued     1,000 mg 200 mL/hr over 60 Minutes Intravenous Every 24 hours 11/04/16 1312 11/05/16 0933   11/04/16 1045  piperacillin-tazobactam (ZOSYN) IVPB 3.375 g     3.375 g 100 mL/hr over  30 Minutes Intravenous  Once 11/04/16 1038 11/04/16 1433   11/04/16 1045  vancomycin (VANCOCIN) IVPB 1000 mg/200 mL premix     1,000 mg 200 mL/hr over 60 Minutes Intravenous  Once 11/04/16 1038 11/04/16 1433      Assessment/Plan: Doing well D/W Intensivist, may xfer  tomorrow or if there is a bed need later today Continue A/Bs and NGT for ileus DC vanco Continue fluids for now  Hold off lasix and PO Antihypertensive in the acute setting and given his recent state of septic shock   Sterling Big, MD, FACS  11/05/2016

## 2016-11-05 NOTE — Progress Notes (Signed)
Pharmacy Antibiotic Note  Louis Lawson is a 83 y.o. male admitted on 11/04/2016 with sepsis.  Pharmacy has been consulted for vancomycin and piperacillin/tazobactam dosing. Vancomycin 1000 mg given in ED.   Plan: Continue Piperacillin/tazobactam 3.375 g IV q8h EI  CrCl/Scr improved today. Will adjust dose to Vancomycin 100mg  IV every 18 hours. Goal VT 15-20 mcg/mL  Kinetics: Adjusted body weight = 85 kg Ke: 0.043  Half-life: 16 hrs Vd: 61.6 L  Height: 5\' 11"  (180.3 cm) Weight: 234 lb 9.1 oz (106.4 kg) IBW/kg (Calculated) : 75.3  Temp (24hrs), Avg:99.1 F (37.3 C), Min:98.1 F (36.7 C), Max:102 F (38.9 C)   Recent Labs Lab 11/04/16 1040 11/04/16 1121 11/04/16 1804 11/05/16 0805  WBC  --  15.5* 7.2 12.5*  CREATININE  --  1.62* 1.55* 1.27*  LATICACIDVEN 2.6*  --  3.6*  --     Estimated Creatinine Clearance: 46 mL/min (A) (by C-G formula based on SCr of 1.27 mg/dL (H)).    No Known Allergies  Antimicrobials this admission: vancomycin 7/24 >>  Piperacillin/tazobactam 7/24 >>   Dose adjustments this admission:  Microbiology results: 7/24 BCx: Sent 7/24 UCx: Sent   Thank you for allowing pharmacy to be a part of this patient's care.  Gardner CandleSheema M Christobal Morado, PharmD, BCPS Clinical Pharmacist 11/05/2016 11:54 AM

## 2016-11-05 NOTE — Plan of Care (Signed)
Problem: Pain Managment: Goal: General experience of comfort will improve Outcome: Progressing Pt was extubated overnight and remains on room air. Pt denies any pain.

## 2016-11-05 NOTE — Progress Notes (Signed)
Pt arrived to 202 from ICU. VSS,telemetry was set up, midline incision was assessed, NG tube hooked up to low intermittent suction, HOB >30 degrees, and fluids were set up. Pt is alert and oriented and was orientated to the room and floor.   [employer]

## 2016-11-05 NOTE — Consult Note (Signed)
   Name: Louis Lawson MRN: [id number] DOB: March 09, 1923        SUBJECTIVE:   83 year old male POD 1 emergent exploratory laparoscopy with hernia repair as well Pt now extubated.   Patient remains critically ill at this time can provide history or ros.   REVIEW OF SYSTEMS:   Unable to obtain due to critical illness   VITAL SIGNS: Temp:  [98.1 F (36.7 C)-102 F (38.9 C)] 98.4 F (36.9 C) (07/25 0400) Pulse Rate:  [81-110] 82 (07/25 0700) Resp:  [10-35] 17 (07/25 0700) BP: (117-177)/(71-104) 153/83 (07/25 0700) SpO2:  [91 %-100 %] 91 % (07/25 0700) Arterial Line BP: (86-163)/(62-117) 114/107 (07/25 0615) FiO2 (%):  [30 %-45 %] 45 % (07/24 2047) Weight:  [234 lb 9.1 oz (106.4 kg)] 234 lb 9.1 oz (106.4 kg) (07/25 0433)  Physical Examination:  GENERAL:critically ill appearing, +resp distress HEAD: Normocephalic, atraumatic.  EYES: Pupils equal, round, reactive to light.  No scleral icterus.  MOUTH: Moist mucosal membrane. NECK: Supple. No thyromegaly. No nodules. No JVD.  PULMONARY: +rhonchi, +wheezing CARDIOVASCULAR: S1 and S2. Regular rate and rhythm. No murmurs, rubs, or gallops.  GASTROINTESTINAL: Soft, nontender, -distended. No masses. Positive bowel sounds. No hepatosplenomegaly.  MUSCULOSKELETAL: No swelling, clubbing, or edema.  NEUROLOGIC: obtunded SKIN:intact,warm,dry        Recent Labs Lab 11/04/16 1121 11/04/16 1804 11/05/16 0805  NA 135 134* 137  K 4.0 3.4* 3.8  CL 99* 101 104  CO2 26 23 26   BUN 23* 22* 23*  CREATININE 1.62* 1.55* 1.27*  GLUCOSE 137* 148* 159*    Recent Labs Lab 11/04/16 1121 11/04/16 1804 11/05/16 0805  HGB 13.4 15.0 13.6  HCT 39.8* 45.0 40.5  WBC 15.5* 7.2 12.5*  PLT 165 156 141*     ASSESSMENT / PLAN: 83 year old male admitted to the ICU for postop respiratory failure likely from acidosis and shock related to perforated appendix with acute abdomen  #1 continue ICU support #2 wean oxygen as needed #3 sedation as  needed #4 vasopressors as needed to keep map above 65 Continue ICU monitoring DVT and GI prophylaxis ordered  Deep Nicholos Johnsamachandran, MD.   Board Certified in Internal Medicine, Pulmonary Medicine, Critical Care Medicine, and Sleep Medicine.  [facility] Office Number: [phone] Pager: [phone]  Santiago Gladavid Kasa, M.D.  Billy Fischeravid Simonds, M.D

## 2016-11-06 LAB — BASIC METABOLIC PANEL
ANION GAP: 6 (ref 5–15)
BUN: 21 mg/dL — ABNORMAL HIGH (ref 6–20)
CO2: 28 mmol/L (ref 22–32)
Calcium: 8.3 mg/dL — ABNORMAL LOW (ref 8.9–10.3)
Chloride: 105 mmol/L (ref 101–111)
Creatinine, Ser: 1.14 mg/dL (ref 0.61–1.24)
GFR, EST NON AFRICAN AMERICAN: 54 mL/min — AB (ref >60)
GLUCOSE: 138 mg/dL — AB (ref 65–99)
POTASSIUM: 4.4 mmol/L (ref 3.5–5.1)
Sodium: 139 mmol/L (ref 135–145)

## 2016-11-06 LAB — MAGNESIUM: Magnesium: 2.1 mg/dL (ref 1.7–2.4)

## 2016-11-06 LAB — CBC
HEMATOCRIT: 40.6 % (ref 40.0–52.0)
Hemoglobin: 13.7 g/dL (ref 13.0–18.0)
MCH: 31.9 pg (ref 26.0–34.0)
MCHC: 33.8 g/dL (ref 32.0–36.0)
MCV: 94.6 fL (ref 80.0–100.0)
Platelets: 148 10*3/uL — ABNORMAL LOW (ref 150–440)
RBC: 4.29 MIL/uL — AB (ref 4.40–5.90)
RDW: 15.2 % — ABNORMAL HIGH (ref 11.5–14.5)
WBC: 13.8 10*3/uL — AB (ref 3.8–10.6)

## 2016-11-06 LAB — SURGICAL PATHOLOGY

## 2016-11-06 MED ORDER — METOPROLOL TARTRATE 5 MG/5ML IV SOLN
5.0000 mg | Freq: Four times a day (QID) | INTRAVENOUS | Status: DC
  Administered 2016-11-06 – 2016-11-07 (×4): 5 mg via INTRAVENOUS
  Filled 2016-11-06 (×4): qty 5

## 2016-11-06 MED ORDER — METOPROLOL TARTRATE 5 MG/5ML IV SOLN: 5.0000 mg | Freq: Four times a day (QID) | INTRAVENOUS | Status: DC

## 2016-11-06 NOTE — Progress Notes (Addendum)
Per Dr. Tonita CongWoodham okay to discontinue NG tube and order set that goes along with the order set.

## 2016-11-06 NOTE — Progress Notes (Signed)
Per Dr. Tonita CongWoodham do not give oral metoprolol, MD to place order for IV push.

## 2016-11-06 NOTE — Progress Notes (Signed)
2 Days Post-Op   Subjective:  Patient transferred to the ICU last night. He reports feeling okay this morning. Desires have the tube removed from his nose if possible. Denies any flatus but states he has the urge.  Vital signs in last 24 hours: Temp:  [97.5 F (36.4 C)-98.7 F (37.1 C)] 98.3 F (36.8 C) (07/26 0443) Pulse Rate:  [68-93] 68 (07/26 0928) Resp:  [8-27] 18 (07/26 0443) BP: (149-176)/(72-88) 174/81 (07/26 0928) SpO2:  [92 %-96 %] 94 % (07/26 0443) Weight:  [110.2 kg (243 lb)] 110.2 kg (243 lb) (07/26 0500) Last BM Date:  (pt unsure)  Intake/Output from previous day: 07/25 0701 - 07/26 0700 In: 1503 [I.V.:1503] Out: 2320 [Urine:1850; Emesis/NG output:100; Drains:370]  GI: Abdomen soft, appropriately tender to palpation, nondistended. Midline wound is open with healthy-appearing subcutaneous tissues without spreading erythema or purulence. JP drain in the right lower quadrant with copious amounts of serosanguineous fluid output.  Lab Results:  CBC  Recent Labs  11/05/16 0805 11/06/16 0447  WBC 12.5* 13.8*  HGB 13.6 13.7  HCT 40.5 40.6  PLT 141* 148*   CMP     Component Value Date/Time   NA 139 11/06/2016 0447   K 4.4 11/06/2016 0447   CL 105 11/06/2016 0447   CO2 28 11/06/2016 0447   GLUCOSE 138 (H) 11/06/2016 0447   BUN 21 (H) 11/06/2016 0447   CREATININE 1.14 11/06/2016 0447   CALCIUM 8.3 (L) 11/06/2016 0447   PROT 6.8 11/05/2016 0805   ALBUMIN 2.9 (L) 11/05/2016 0805   AST 28 11/05/2016 0805   ALT 13 (L) 11/05/2016 0805   ALKPHOS 31 (L) 11/05/2016 0805   BILITOT 1.9 (H) 11/05/2016 0805   GFRNONAA 54 (L) 11/06/2016 0447   GFRAA >60 11/06/2016 0447   PT/INR No results for input(s): LABPROT, INR in the last 72 hours.  Studies/Results: Dg Abdomen 1 View  Result Date: 11/04/2016 CLINICAL DATA:  Abdominal pain. EXAM: ABDOMEN - 1 VIEW COMPARISON:  No recent . FINDINGS: Distended loops of bowel noted in the mid abdomen possibly small bowel. No  colonic distention. Partial small-bowel obstruction cannot be excluded. No free air. IMPRESSION: Distended loops of small bowel with normal colonic gas pattern. Partial small-bowel obstruction cannot be excluded. Follow-up exam suggested. Electronically Signed   By: Maisie Fus  Register   On: 11/04/2016 11:16   Ct Abdomen Pelvis W Contrast  Result Date: 11/04/2016 CLINICAL DATA:  Abdominal pain for 2 days weakness, fever, tachycardia, sepsis, history hypertension, coronary artery disease, former smoker EXAM: CT ABDOMEN AND PELVIS WITH CONTRAST TECHNIQUE: Multidetector CT imaging of the abdomen and pelvis was performed using the standard protocol following bolus administration of intravenous contrast. Sagittal and coronal MPR images reconstructed from axial data set. CONTRAST:  75mL ISOVUE-300 IOPAMIDOL (ISOVUE-300) INJECTION 61% IV. Dilute oral contrast. COMPARISON:  None FINDINGS: Lower chest: Minimal RIGHT basilar atelectasis. Questionable 4 mm lingular nodule image 2. Lung bases otherwise clear. Hepatobiliary: Depending calculi in gallbladder up to 12 mm diameter. No gallbladder wall thickening or biliary dilatation. Liver unremarkable. Pancreas: Normal appearance Spleen: Normal appearance Adrenals/Urinary Tract: LEFT adrenal nodule 18 x 12 mm question tiny adenoma RIGHT adrenal gland unremarkable. Two RIGHT renal cysts measuring 6.0 x 4.8 cm at inferior pole and 4.5 x 4.4 cm at upper pole. No urinary tract calcification, hydronephrosis, ureteral dilatation, or additional renal mass lesion. Minimal bladder wall thickening which could be related to chronic outlet obstruction in patient with mild prostatic enlargement, gland 5.3 x 3.8 cm image  86. Stomach/Bowel: Short segment of normal proximal appendix visualized, distally not well localized. Stomach decompressed. Colon unremarkable. Multiple dilated small bowel loops in mid abdomen with significant infiltrative/inflammatory changes in the small bowel mesenteries.  A few tiny foci of gas are identified adjacent to thickened small bowel loops within this collection likely representing foci of extra intestinal gas and reflecting localized small bowel perforation, doubt tiny small bowel diverticulum with none additional seen elsewhere. No definite abscess collection. Vascular/Lymphatic: Atherosclerotic calcifications aorta with minimal aneurysmal dilatation of the distal abdominal aorta to a greatest size of 3.0 x 2.6 cm. Additional atherosclerotic calcifications in the iliac and femoral arteries as well as coronary arteries. No adenopathy. Reproductive: N/A Other: Tiny umbilical hernia containing fat and fluid. No free intraperitoneal air. Small amount of free fluid in pelvis. Musculoskeletal: Discogenic sclerosis at L4-L5. Scattered degenerative disc and facet disease changes lumbar spine. Degenerative changes of the hips. Fatty atrophy of the paraspinal muscles. IMPRESSION: Small bowel inflammatory process in the mid abdomen could reflect a severe enteritis with several tiny foci of suspected extraluminal gas adjacent to small bowel loops in this region, highly suspicious for localized perforation either on the basis of enteritis or foreign body perforation. Minimal free intraperitoneal fluid without free air. Some of the observed small bowel loops are mildly dilated which could be related to regional ileus or mild degree of obstruction. Minimal aneurysmal dilatation of the distal abdominal aorta 3.0 x 2.6 cm. Cholelithiasis. RIGHT renal cysts. Aortic aneurysm NOS (ICD10-I71.9). Aortic Atherosclerosis (ICD10-I70.0). Findings called to Dr. Cyril LoosenKinner on 11/04/2016 at 1318 hrs. Electronically Signed   By: Ulyses SouthwardMark  Boles M.D.   On: 11/04/2016 13:19   Dg Chest Port 1 View  Result Date: 11/05/2016 CLINICAL DATA:  Respiratory failure . EXAM: PORTABLE CHEST 1 VIEW COMPARISON:  11/04/2016 . FINDINGS: Endotracheal tube tip noted approximately 6 cm above the carina. Heart size stable.  Persistent bibasilar atelectasis. Right base infiltrate cannot be excluded. Tiny right pleural effusion. No pneumothorax. IMPRESSION: 1. Endotracheal tube tip noted approximately 6 cm above the carina. 2. Persistent unchanged bibasilar atelectasis. Right base infiltrate cannot be excluded . Electronically Signed   By: Maisie Fushomas  Register   On: 11/05/2016 06:50   Dg Chest Port 1 View  Result Date: 11/04/2016 CLINICAL DATA:  Perforated appendix, acute respiratory failure EXAM: PORTABLE CHEST 1 VIEW COMPARISON:  11/04/2016 FINDINGS: Interim placement of endotracheal tube, the tip is about 4.5 cm superior to the carina. Minimal basilar atelectasis. Stable enlarged cardiomediastinal with atherosclerosis. No consolidation or effusion. No pneumothorax. IMPRESSION: 1. Endotracheal tube tip about 4.5 cm superior to carina 2. Stable cardiomegaly 3. Minimal basilar atelectasis Electronically Signed   By: Jasmine PangKim  Fujinaga M.D.   On: 11/04/2016 18:54   Dg Chest Port 1 View  Result Date: 11/04/2016 CLINICAL DATA:  Abdominal pain for 2 days.  Weakness, fever EXAM: PORTABLE CHEST 1 VIEW COMPARISON:  04/07/2015 FINDINGS: Heart is borderline in size. Lungs are clear. No effusions or acute bony abnormality. IMPRESSION: No active disease. Electronically Signed   By: Charlett NoseKevin  Dover M.D.   On: 11/04/2016 11:16    Assessment/Plan: 83 year old male status post export her laparotomy for perforated appendicitis. Improving as expected. NG tube output was low overnight but was found to be occluded this morning. We will reevaluate NG tube output later this morning to determine whether or not it can be removed. Discussed with patient today's goal was to get out of bed and sit in the chair for the majority day. Ambulate as  possible out of the bed to chair is required. Encourage incentive spirometer usage. Continue IV antibiotics. Continue JP drain. Continue local wound care. Continue Foley catheter until patient is more mobile.   Ricarda Frameharles  Zoiee Wimmer, MD [facility] General Surgeon [facility]  Day [facility] [phone](7a-7p) [phone] Night [facility] [phone](7p-7a) [phone]  11/06/2016

## 2016-11-06 NOTE — Progress Notes (Signed)
Per Dr. Pabon okay to discontinue order for continuous pulse ox.  

## 2016-11-07 LAB — CBC
HEMATOCRIT: 39.3 % — AB (ref 40.0–52.0)
Hemoglobin: 13.3 g/dL (ref 13.0–18.0)
MCH: 31.9 pg (ref 26.0–34.0)
MCHC: 33.9 g/dL (ref 32.0–36.0)
MCV: 94.3 fL (ref 80.0–100.0)
Platelets: 188 10*3/uL (ref 150–440)
RBC: 4.17 MIL/uL — ABNORMAL LOW (ref 4.40–5.90)
RDW: 15.5 % — AB (ref 11.5–14.5)
WBC: 14.1 10*3/uL — ABNORMAL HIGH (ref 3.8–10.6)

## 2016-11-07 LAB — BASIC METABOLIC PANEL
Anion gap: 6 (ref 5–15)
BUN: 22 mg/dL — ABNORMAL HIGH (ref 6–20)
CALCIUM: 8.4 mg/dL — AB (ref 8.9–10.3)
CO2: 25 mmol/L (ref 22–32)
CREATININE: 1.05 mg/dL (ref 0.61–1.24)
Chloride: 106 mmol/L (ref 101–111)
GFR calc non Af Amer: 59 mL/min — ABNORMAL LOW (ref >60)
GLUCOSE: 142 mg/dL — AB (ref 65–99)
Potassium: 4.2 mmol/L (ref 3.5–5.1)
Sodium: 137 mmol/L (ref 135–145)

## 2016-11-07 LAB — TRIGLYCERIDES: TRIGLYCERIDES: 139 mg/dL (ref <150)

## 2016-11-07 NOTE — Progress Notes (Signed)
3 Days Post-Op   Subjective:  Patient reports doing well this morning. States he is hungry. Pain is well-controlled and denies any nausea or vomiting since removal his NG tube yesterday.  Vital signs in last 24 hours: Temp:  [97.4 F (36.3 C)-97.8 F (36.6 C)] 97.4 F (36.3 C) (07/27 0451) Pulse Rate:  [61-68] 61 (07/27 0451) Resp:  [18-20] 20 (07/27 0451) BP: (168-177)/(75-85) 177/85 (07/27 0451) SpO2:  [93 %-95 %] 95 % (07/27 0451) Weight:  [110.7 kg (244 lb)] 110.7 kg (244 lb) (07/27 0310) Last BM Date:  (pt unsure)  Intake/Output from previous day: 07/26 0701 - 07/27 0700 In: [id number] [I.V.:6763; IV Piggyback:29] Out: 2555 [Urine:1100; Emesis/NG output:150; Drains:1305]  GI: Abdomen soft, nontender, moderately distended. Midline skin incision is open but with healthy-appearing deep tissue. No evidence of purulence or erythema to the midline. JP drain in the right lower quadrant with copious amounts of serous fluid  Lab Results:  CBC  Recent Labs  11/05/16 0805 11/06/16 0447  WBC 12.5* 13.8*  HGB 13.6 13.7  HCT 40.5 40.6  PLT 141* 148*   CMP     Component Value Date/Time   NA 139 11/06/2016 0447   K 4.4 11/06/2016 0447   CL 105 11/06/2016 0447   CO2 28 11/06/2016 0447   GLUCOSE 138 (H) 11/06/2016 0447   BUN 21 (H) 11/06/2016 0447   CREATININE 1.14 11/06/2016 0447   CALCIUM 8.3 (L) 11/06/2016 0447   PROT 6.8 11/05/2016 0805   ALBUMIN 2.9 (L) 11/05/2016 0805   AST 28 11/05/2016 0805   ALT 13 (L) 11/05/2016 0805   ALKPHOS 31 (L) 11/05/2016 0805   BILITOT 1.9 (H) 11/05/2016 0805   GFRNONAA 54 (L) 11/06/2016 0447   GFRAA >60 11/06/2016 0447   PT/INR No results for input(s): LABPROT, INR in the last 72 hours.  Studies/Results: No results found.  Assessment/Plan: 83 year old male status post export her laparotomy for ruptured appendicitis. Doing remarkably well. Plan to start clear liquid diet today. Attempt to remove Foley catheter. Physical therapy to evaluate  patient to work on ambulation. Discussed continued wound care with his nurse. If tolerates his liquid diet we will start resuming some of his home oral medications.   Ricarda Frameharles Nochum Fenter, MD [facility] General Surgeon [facility]  Day [facility] [phone](7a-7p) [phone] Night [facility] [phone](7p-7a) [phone]  11/07/2016

## 2016-11-07 NOTE — Progress Notes (Signed)
Pharmacy Antibiotic Note  Louis Lawson is a 83 y.o. male admitted on 11/04/2016 with ruptured appendicitis s/p exlap.  Pharmacy has been consulted for Zosyn dosing.  Plan: Continue Piperacillin/tazobactam 3.375 g IV q8h EI  Height: 5\' 11"  (180.3 cm) Weight: 244 lb (110.7 kg) IBW/kg (Calculated) : 75.3  Temp (24hrs), Avg:97.7 F (36.5 C), Min:97.4 F (36.3 C), Max:97.8 F (36.6 C)   Recent Labs Lab 11/04/16 1040 11/04/16 1121 11/04/16 1804 11/05/16 0805 11/06/16 0447 11/07/16 0952  WBC  --  15.5* 7.2 12.5* 13.8* 14.1*  CREATININE  --  1.62* 1.55* 1.27* 1.14 1.05  LATICACIDVEN 2.6*  --  3.6*  --   --   --     Estimated Creatinine Clearance: 56.8 mL/min (by C-G formula based on SCr of 1.05 mg/dL).    No Known Allergies  Antimicrobials this admission: vancomycin 7/24 x 1 Piperacillin/tazobactam 7/24 >>   Dose adjustments this admission:  Microbiology results: 7/24 BCx: NGTD 7/24 UCx: insignificant growth MRSA PCR: negative Thank you for allowing pharmacy to be a part of this patient's care.  Valentina Guhristy, Ayven Pheasant D, PharmD, BCPS Clinical Pharmacist 11/07/2016 1:14 PM

## 2016-11-07 NOTE — Care Management Important Message (Signed)
Important Message  Patient Details  Name: Louis Lawson MRN: [id number] Date of Birth: 1923/01/13   Medicare Important Message Given:  Yes    Chapman FitchBOWEN, Jazsmine Macari T, RN 11/07/2016, 11:15 AM

## 2016-11-07 NOTE — Progress Notes (Signed)
Per Dr. Tonita CongWoodham okay to decrease fluids to 2475ml/hr as pt is tolerating his diet at this time.

## 2016-11-07 NOTE — Progress Notes (Signed)
Per Dr. Tonita CongWoodham okay to give oral metoprolol as pt tolerated breakfast well with no complaints. Per MD okay to discontinue order for IV metoprolol.

## 2016-11-07 NOTE — Progress Notes (Signed)
Per Dr. Tonita CongWoodham place order for Clear liquids, PT consult to evaluate patient, and discontinue foley catheter.

## 2016-11-07 NOTE — Evaluation (Signed)
Physical Therapy Evaluation Patient Details Name: Louis Lawson MRN: [id number] DOB: 07-15-1922 Today's Date: 11/07/2016   History of Present Illness  pt is a 83 y.o M who was admitted on 11/04/16 with dx of perforated appendcitis and has a hx of CAD, HTN, and irregular heart beat. He underwent a exploratory lapraotomy and appendectomy on 11/04/2016.   Clinical Impression  Upon arrival pt was sitting in the recliner. He currently reports no pain or discomfort. He is able to perform sit to stand transitions required multiple rocking. He was able to ambulate in the room requiring +2 assist due to fatigue ~15 ft. He requires verbal cues to stand up straight and stay with the walker, he performed the exercise well reporting the glute set was the hardest. He currently lives with his family at home with stairs to enter and uses a RW at home for amb. He plans to return home once discharged. He would benefit from physical therapy to increase LE strength, train bed / transfer mobility, and increase endurance and DME training to maximize safety and function.     Follow Up Recommendations Home health PT    Equipment Recommendations  Rolling walker with 5" wheels    Recommendations for Other Services       Precautions / Restrictions Precautions Precautions: None Restrictions Weight Bearing Restrictions: No      Mobility  Bed Mobility               General bed mobility comments: unable to assess due to pt already sitting in chair   Transfers Overall transfer level: Modified independent Equipment used: Rolling walker (2 wheeled)             General transfer comment: mutliple trials of rocking forward before slowly standing up pausing in the middle  Ambulation/Gait Ambulation/Gait assistance: Mod assist Ambulation Distance (Feet): 15 Feet Assistive device: Rolling walker (2 wheeled) Gait Pattern/deviations: Step-through pattern;Decreased stride length;Antalgic;Trunk flexed      General Gait Details: trunk flexed requiring verbal cues to stay with RW and not let it lead him and to tuck his bottom in (pt fatigues quickly)  Stairs            Wheelchair Mobility    Modified Rankin (Stroke Patients Only)       Balance                                             Pertinent Vitals/Pain      Home Living Family/patient expects to be discharged to:: Private residence Living Arrangements: Spouse/significant other Available Help at Discharge: Family Type of Home: House Home Access: Stairs to enter Entrance Stairs-Rails: Right Entrance Stairs-Number of Steps: 3 Home Layout: One level Home Equipment: Environmental consultantWalker - 2 wheels      Prior Function Level of Independence: Independent with assistive device(s)         Comments: used a RW at home walking short distance per family     Hand Dominance   Dominant Hand: Right    Extremity/Trunk Assessment   Upper Extremity Assessment Upper Extremity Assessment: Overall WFL for tasks assessed    Lower Extremity Assessment Lower Extremity Assessment: Overall WFL for tasks assessed (4/5 with bil LE testing)       Communication   Communication: No difficulties  Cognition Arousal/Alertness: Awake/alert Behavior During Therapy: WFL for tasks assessed/performed Overall Cognitive Status: Within Functional  Limits for tasks assessed                                        General Comments      Exercises Other Exercises Other Exercises: knee extension 2 x 10 ea, seated marching 2 x 15 ea. bil heel/ toe rasie 2 x 15, glute set 1 x 10 with 5 sec hold, resited hip abduction 2 x 10   Assessment/Plan    PT Assessment Patient needs continued PT services  PT Problem List Decreased activity tolerance;Decreased balance;Decreased mobility;Decreased safety awareness;Decreased knowledge of use of DME       PT Treatment Interventions Gait training;DME instruction;Therapeutic  exercise;Therapeutic activities;Balance training;Patient/family education;Stair training    PT Goals (Current goals can be found in the Care Plan section)  Acute Rehab PT Goals Patient Stated Goal: to get stronger and go home PT Goal Formulation: With patient Time For Goal Achievement: 12/05/16 Potential to Achieve Goals: Good    Frequency Min 2X/week   Barriers to discharge        Co-evaluation               AM-PAC PT "6 Clicks" Daily Activity  Outcome Measure Difficulty turning over in bed (including adjusting bedclothes, sheets and blankets)?: Total Difficulty moving from lying on back to sitting on the side of the bed? : Total Difficulty sitting down on and standing up from a chair with arms (e.g., wheelchair, bedside commode, etc,.)?: Total Help needed moving to and from a bed to chair (including a wheelchair)?: A Lot Help needed walking in hospital room?: A Lot Help needed climbing 3-5 steps with a railing? : A Lot 6 Click Score: 9    End of Session Equipment Utilized During Treatment: Gait belt Activity Tolerance: Patient limited by fatigue Patient left: in chair;with chair alarm set;with call bell/phone within reach;with family/visitor present Nurse Communication: Mobility status PT Visit Diagnosis: Unsteadiness on feet (R26.81);Muscle weakness (generalized) (M62.81);Other abnormalities of gait and mobility (R26.89);Difficulty in walking, not elsewhere classified (R26.2)    Time: [id number] PT Time Calculation (min) (ACUTE ONLY): 35 min   Charges:   PT Evaluation $PT Eval Moderate Complexity: 1 Procedure PT Treatments $Gait Training: 8-22 mins $Therapeutic Exercise: 8-22 mins   PT G Codes:   PT G-Codes **NOT FOR INPATIENT CLASS** Functional Limitation: Mobility: Walking and moving around Mobility: Walking and Moving Around Current Status ([id number]([id number]): At least 60 percent but less than 80 percent impaired, limited or restricted Mobility: Walking and Moving  Around Goal Status [phone](G8979): At least 40 percent but less than 60 percent impaired, limited or restricted    Tiffancy Moger PT, DPT, LAT, ATC  11/07/16  5:59 PM       Yong Grieser 11/07/2016, 5:55 PM

## 2016-11-07 NOTE — Progress Notes (Signed)
Notified Dr. Everlene FarrierPabon that pt had a 6 beat run of SVT. Pt asymptomatic. No new orders received will continue to monitor.

## 2016-11-08 LAB — BASIC METABOLIC PANEL
Anion gap: 8 (ref 5–15)
BUN: 20 mg/dL (ref 6–20)
CALCIUM: 8.3 mg/dL — AB (ref 8.9–10.3)
CO2: 26 mmol/L (ref 22–32)
Chloride: 105 mmol/L (ref 101–111)
Creatinine, Ser: 1.01 mg/dL (ref 0.61–1.24)
GFR calc Af Amer: >60 mL/min (ref >60)
Glucose, Bld: 117 mg/dL — ABNORMAL HIGH (ref 65–99)
POTASSIUM: 4.1 mmol/L (ref 3.5–5.1)
SODIUM: 139 mmol/L (ref 135–145)

## 2016-11-08 LAB — CBC
HCT: 41.2 % (ref 40.0–52.0)
Hemoglobin: 13.7 g/dL (ref 13.0–18.0)
MCH: 32 pg (ref 26.0–34.0)
MCHC: 33.3 g/dL (ref 32.0–36.0)
MCV: 96.1 fL (ref 80.0–100.0)
PLATELETS: 198 10*3/uL (ref 150–440)
RBC: 4.29 MIL/uL — AB (ref 4.40–5.90)
RDW: 15.5 % — AB (ref 11.5–14.5)
WBC: 12.5 10*3/uL — AB (ref 3.8–10.6)

## 2016-11-08 MED ORDER — MAGNESIUM HYDROXIDE 400 MG/5ML PO SUSP
30.0000 mL | Freq: Every day | ORAL | Status: DC | PRN
  Filled 2016-11-08: qty 30

## 2016-11-08 NOTE — Progress Notes (Signed)
4 Days Post-Op  Subjective: Status post ruptured appendix. He is tolerating a clear liquid diet. He has no abdominal pain.  Objective: Vital signs in last 24 hours: Temp:  [97.8 F (36.6 C)-98.7 F (37.1 C)] 98.7 F (37.1 C) (07/27 2007) Pulse Rate:  [55-62] 60 (07/28 0427) Resp:  [20-22] 20 (07/28 0427) BP: (151-176)/(68-84) 176/78 (07/28 0427) SpO2:  [93 %-95 %] 95 % (07/28 0427) Weight:  [242 lb 8 oz (110 kg)] 242 lb 8 oz (110 kg) (07/28 0422) Last BM Date:  (pt unsure)  Intake/Output from previous day: 07/27 0701 - 07/28 0700 In: 6005 [P.O.:1560; I.V.:4183; IV Piggyback:262] Out: 2085 [Urine:1050; Drains:1035] Intake/Output this shift: Total I/O In: 213 [I.V.:213] Out: 250 [Urine:150; Drains:100]  Physical exam:  Wound is clean without erythema it is granulating already. There is no purulence. Drain is in place. Otherwise soft and nontender  Lab Results: CBC   Recent Labs  11/07/16 0952 11/08/16 0329  WBC 14.1* 12.5*  HGB 13.3 13.7  HCT 39.3* 41.2  PLT 188 198   BMET  Recent Labs  11/07/16 0952 11/08/16 0329  NA 137 139  K 4.2 4.1  CL 106 105  CO2 25 26  GLUCOSE 142* 117*  BUN 22* 20  CREATININE 1.05 1.01  CALCIUM 8.4* 8.3*   PT/INR No results for input(s): LABPROT, INR in the last 72 hours. ABG No results for input(s): PHART, HCO3 in the last 72 hours.  Invalid input(s): PCO2, PO2  Studies/Results: No results found.  Anti-infectives: Anti-infectives    Start     Dose/Rate Route Frequency Ordered Stop   11/05/16 1000  vancomycin (VANCOCIN) IVPB 1000 mg/200 mL premix  Status:  Discontinued     1,000 mg 200 mL/hr over 60 Minutes Intravenous Every 18 hours 11/05/16 0933 11/05/16 1027   11/04/16 2000  piperacillin-tazobactam (ZOSYN) IVPB 3.375 g     3.375 g 12.5 mL/hr over 240 Minutes Intravenous Every 8 hours 11/04/16 1308     11/04/16 1700  vancomycin (VANCOCIN) IVPB 1000 mg/200 mL premix  Status:  Discontinued     1,000 mg 200 mL/hr  over 60 Minutes Intravenous Every 24 hours 11/04/16 1312 11/05/16 0933   11/04/16 1045  piperacillin-tazobactam (ZOSYN) IVPB 3.375 g     3.375 g 100 mL/hr over 30 Minutes Intravenous  Once 11/04/16 1038 11/04/16 1433   11/04/16 1045  vancomycin (VANCOCIN) IVPB 1000 mg/200 mL premix     1,000 mg 200 mL/hr over 60 Minutes Intravenous  Once 11/04/16 1038 11/04/16 1433      Assessment/Plan: s/p Procedure(s): EXPLORATORY LAPAROTOMY APPENDECTOMY HERNIA REPAIR UMBILICAL ADULT   Will advance diet at this point. Continue current wound care.  Lattie Hawichard E Jaret Coppedge, MD, Carilyn GoodpastureFACS  11/08/2016

## 2016-11-09 LAB — CBC WITH DIFFERENTIAL/PLATELET
Basophils Absolute: 0 10*3/uL (ref 0–0.1)
Basophils Relative: 0 %
EOS PCT: 1 %
Eosinophils Absolute: 0.1 10*3/uL (ref 0–0.7)
HEMATOCRIT: 39.8 % — AB (ref 40.0–52.0)
Hemoglobin: 13.1 g/dL (ref 13.0–18.0)
LYMPHS ABS: 1.2 10*3/uL (ref 1.0–3.6)
LYMPHS PCT: 10 %
MCH: 31.2 pg (ref 26.0–34.0)
MCHC: 32.9 g/dL (ref 32.0–36.0)
MCV: 95 fL (ref 80.0–100.0)
Monocytes Absolute: 1.3 10*3/uL — ABNORMAL HIGH (ref 0.2–1.0)
Monocytes Relative: 11 %
Neutro Abs: 9.2 10*3/uL — ABNORMAL HIGH (ref 1.4–6.5)
Neutrophils Relative %: 78 %
Platelets: 216 10*3/uL (ref 150–440)
RBC: 4.19 MIL/uL — AB (ref 4.40–5.90)
RDW: 15.5 % — AB (ref 11.5–14.5)
WBC: 11.9 10*3/uL — AB (ref 3.8–10.6)

## 2016-11-09 LAB — BASIC METABOLIC PANEL
ANION GAP: 6 (ref 5–15)
BUN: 20 mg/dL (ref 6–20)
CALCIUM: 8.2 mg/dL — AB (ref 8.9–10.3)
CO2: 26 mmol/L (ref 22–32)
CREATININE: 0.97 mg/dL (ref 0.61–1.24)
Chloride: 105 mmol/L (ref 101–111)
GLUCOSE: 118 mg/dL — AB (ref 65–99)
Potassium: 3.9 mmol/L (ref 3.5–5.1)
Sodium: 137 mmol/L (ref 135–145)

## 2016-11-09 LAB — CULTURE, BLOOD (ROUTINE X 2)
CULTURE: NO GROWTH
Culture: NO GROWTH

## 2016-11-09 NOTE — Progress Notes (Signed)
5 Days Post-Op  Subjective: Status post laparotomy for ruptured appendix. He is tolerating a soft diet.  Objective: Vital signs in last 24 hours: Temp:  [97.5 F (36.4 C)-98 F (36.7 C)] 97.5 F (36.4 C) (07/29 0551) Pulse Rate:  [54-101] 60 (07/29 0551) Resp:  [20-21] 20 (07/29 0551) BP: (143-177)/(57-79) 177/70 (07/29 0551) SpO2:  [95 %] 95 % (07/29 0551) Weight:  [244 lb (110.7 kg)] 244 lb (110.7 kg) (07/29 0500) Last BM Date: 11/08/16  Intake/Output from previous day: 07/28 0701 - 07/29 0700 In: 1803 [P.O.:1440; I.V.:213; IV Piggyback:150] Out: 1050 [Urine:400; Drains:650] Intake/Output this shift: Total I/O In: -  Out: 100 [Urine:100]  Physical exam:  Afebrile soft nontender abdomen wound is granulating. Serous fluid in the JP drain no purulence.  Lab Results: CBC   Recent Labs  11/08/16 0329 11/09/16 0303  WBC 12.5* 11.9*  HGB 13.7 13.1  HCT 41.2 39.8*  PLT 198 216   BMET  Recent Labs  11/08/16 0329 11/09/16 0303  NA 139 137  K 4.1 3.9  CL 105 105  CO2 26 26  GLUCOSE 117* 118*  BUN 20 20  CREATININE 1.01 0.97  CALCIUM 8.3* 8.2*   PT/INR No results for input(s): LABPROT, INR in the last 72 hours. ABG No results for input(s): PHART, HCO3 in the last 72 hours.  Invalid input(s): PCO2, PO2  Studies/Results: No results found.  Anti-infectives: Anti-infectives    Start     Dose/Rate Route Frequency Ordered Stop   11/05/16 1000  vancomycin (VANCOCIN) IVPB 1000 mg/200 mL premix  Status:  Discontinued     1,000 mg 200 mL/hr over 60 Minutes Intravenous Every 18 hours 11/05/16 0933 11/05/16 1027   11/04/16 2000  piperacillin-tazobactam (ZOSYN) IVPB 3.375 g     3.375 g 12.5 mL/hr over 240 Minutes Intravenous Every 8 hours 11/04/16 1308     11/04/16 1700  vancomycin (VANCOCIN) IVPB 1000 mg/200 mL premix  Status:  Discontinued     1,000 mg 200 mL/hr over 60 Minutes Intravenous Every 24 hours 11/04/16 1312 11/05/16 0933   11/04/16 1045   piperacillin-tazobactam (ZOSYN) IVPB 3.375 g     3.375 g 100 mL/hr over 30 Minutes Intravenous  Once 11/04/16 1038 11/04/16 1433   11/04/16 1045  vancomycin (VANCOCIN) IVPB 1000 mg/200 mL premix     1,000 mg 200 mL/hr over 60 Minutes Intravenous  Once 11/04/16 1038 11/04/16 1433      Assessment/Plan: s/p Procedure(s): EXPLORATORY LAPAROTOMY APPENDECTOMY HERNIA REPAIR UMBILICAL ADULT   White blood cell count continues to fall currently 11.9. On IV antibiotics. Tolerating a soft diet. Which is being advanced today. He he is doing well and can likely be discharged on oral antibiotics once his white blood cell count comes back towards normal. Apparently he will likely require skilled nursing facility or rehabilitation postoperatively.  Lattie Hawichard E Novie Maggio, MD, FACS  11/09/2016

## 2016-11-09 NOTE — Progress Notes (Signed)
Abdominal dressing was changed this am per order. Patient was assisted up to the recliner. Soft diet has been tolerated without nausea or vomiting. JP drain is intact with serous drainage noted. Patient has been voiding in the urinal. No distress noted.

## 2016-11-10 LAB — CBC WITH DIFFERENTIAL/PLATELET
BASOS PCT: 1 %
Basophils Absolute: 0.1 10*3/uL (ref 0–0.1)
Eosinophils Absolute: 0.1 10*3/uL (ref 0–0.7)
Eosinophils Relative: 1 %
HCT: 36.8 % — ABNORMAL LOW (ref 40.0–52.0)
HEMOGLOBIN: 12.5 g/dL — AB (ref 13.0–18.0)
LYMPHS PCT: 11 %
Lymphs Abs: 1.2 10*3/uL (ref 1.0–3.6)
MCH: 32.1 pg (ref 26.0–34.0)
MCHC: 33.9 g/dL (ref 32.0–36.0)
MCV: 94.5 fL (ref 80.0–100.0)
MONOS PCT: 8 %
Monocytes Absolute: 0.8 10*3/uL (ref 0.2–1.0)
NEUTROS ABS: 8.2 10*3/uL — AB (ref 1.4–6.5)
NEUTROS PCT: 79 %
Platelets: 247 10*3/uL (ref 150–440)
RBC: 3.9 MIL/uL — ABNORMAL LOW (ref 4.40–5.90)
RDW: 15.5 % — ABNORMAL HIGH (ref 11.5–14.5)
WBC: 10.4 10*3/uL (ref 3.8–10.6)

## 2016-11-10 LAB — TRIGLYCERIDES: TRIGLYCERIDES: 193 mg/dL — AB (ref <150)

## 2016-11-10 MED ORDER — GUAIFENESIN-DM 100-10 MG/5ML PO SYRP
5.0000 mL | ORAL_SOLUTION | ORAL | Status: DC | PRN
  Administered 2016-11-10 (×2): 5 mL via ORAL
  Filled 2016-11-10 (×2): qty 5

## 2016-11-10 MED ORDER — AMOXICILLIN-POT CLAVULANATE 875-125 MG PO TABS
1.0000 | ORAL_TABLET | Freq: Two times a day (BID) | ORAL | Status: DC
  Administered 2016-11-10 – 2016-11-11 (×3): 1 via ORAL
  Filled 2016-11-10 (×3): qty 1

## 2016-11-10 MED ORDER — FAMOTIDINE 20 MG PO TABS
20.0000 mg | ORAL_TABLET | Freq: Two times a day (BID) | ORAL | Status: DC
  Administered 2016-11-10 – 2016-11-11 (×3): 20 mg via ORAL
  Filled 2016-11-10 (×3): qty 1

## 2016-11-10 MED ORDER — HYDROCODONE-ACETAMINOPHEN 5-325 MG PO TABS
1.0000 | ORAL_TABLET | ORAL | Status: DC | PRN
  Administered 2016-11-10: 1 via ORAL
  Filled 2016-11-10: qty 1

## 2016-11-10 NOTE — Progress Notes (Deleted)
Pt to be discharged per MD order. Discharging to [facility], report called. Pt will leave via EMS. IV removed. Scripts and summary included in discharge packet.  

## 2016-11-10 NOTE — [place] FL2 (Signed)
[place] MEDICAID FL2 LEVEL OF CARE SCREENING TOOL     IDENTIFICATION  Patient Name: Louis Lawson Birthdate: Sep 11, 1922 Sex: male Admission Date (Current Location): 11/04/2016  [place]   Facility and Address:  [facility], [address], [place], [place] [address]      Provider Number: [id number]  Attending Physician Name and Address:  Erin FullingKasa, Kurian, MD  Relative Name and Phone Number:       Current Level of Care: Hospital Recommended Level of Care: Skilled Nursing Facility Prior Approval Number:    Date Approved/Denied:   PASRR Number:    Discharge Plan: SNF    Current Diagnoses: Patient Active Problem List   Diagnosis Date Noted  . Perforated appendicitis 11/04/2016  . Respiratory failure (HCC) 11/04/2016  . Small bowel perforation (HCC)   . Umbilical hernia without obstruction and without gangrene     Orientation RESPIRATION BLADDER Height & Weight     Self, Time, Situation, Place  Normal Continent Weight: 240 lb 14.4 oz (109.3 kg) Height:  5\' 11"  (180.3 cm)  BEHAVIORAL SYMPTOMS/MOOD NEUROLOGICAL BOWEL NUTRITION STATUS   (none)  (none) Continent Diet (soft)  AMBULATORY STATUS COMMUNICATION OF NEEDS Skin   Limited Assist Verbally Surgical wounds (wet to dry bid)                       Personal Care Assistance Level of Assistance  Bathing, Dressing Bathing Assistance: Limited assistance   Dressing Assistance: Limited assistance     Functional Limitations Info   (no issues)          SPECIAL CARE FACTORS FREQUENCY  PT (By licensed PT)                    Contractures Contractures Info: Not present    Additional Factors Info  Code Status Code Status Info: full             Current Medications (11/10/2016):  This is the current hospital active medication list Current Facility-Administered Medications  Medication Dose Route Frequency Provider Last Rate Last Dose  . 0.9 %  sodium  chloride infusion  1,000 mL Intravenous Once Jene EveryKinner, Robert, MD      . 0.9 %  sodium chloride infusion  250 mL Intravenous PRN Belia HemanKasa, Kurian, MD      . 0.9 %  sodium chloride infusion  250 mL Intravenous PRN Erin FullingKasa, Kurian, MD      . acetaminophen (TYLENOL) tablet 650 mg  650 mg Oral Q4H PRN Erin FullingKasa, Kurian, MD   650 mg at 11/10/16 0146  . amoxicillin-clavulanate (AUGMENTIN) 875-125 MG per tablet 1 tablet  1 tablet Oral Q12H Henrene DodgePiscoya, Jose, MD   1 tablet at 11/10/16 0815  . famotidine (PEPCID) tablet 20 mg  20 mg Oral BID Erin FullingKasa, Kurian, MD   20 mg at 11/10/16 0815  . guaiFENesin-dextromethorphan (ROBITUSSIN DM) 100-10 MG/5ML syrup 5 mL  5 mL Oral Q4H PRN Erin FullingKasa, Kurian, MD   5 mL at 11/10/16 0620  . heparin injection 5,000 Units  5,000 Units Subcutaneous Q8H Pabon, HawaiiDiego F, MD   5,000 Units at 11/10/16 0620  . hydrALAZINE (APRESOLINE) injection 10 mg  10 mg Intravenous Q4H PRN Pabon, Diego F, MD      . HYDROcodone-acetaminophen (NORCO/VICODIN) 5-325 MG per tablet 1 tablet  1 tablet Oral Q4H PRN Henrene DodgePiscoya, Jose, MD   1 tablet at 11/10/16 0816  . magnesium hydroxide (MILK OF MAGNESIA) suspension 30 mL  30 mL Oral Daily PRN Lattie Hawooper, Richard E, MD      . metoprolol succinate (TOPROL-XL) 24 hr tablet 50 mg  50 mg Oral Daily Varughese, Bincy S, NP   50 mg at 11/10/16 0815  . ondansetron (ZOFRAN-ODT) disintegrating tablet 4 mg  4 mg Oral Q6H PRN Pabon, Diego F, MD       Or  . ondansetron (ZOFRAN) injection 4 mg  4 mg Intravenous Q6H PRN Pabon, Diego F, MD      . sodium chloride flush (NS) 0.9 % injection 3 mL  3 mL Intravenous Q12H Erin FullingKasa, Kurian, MD   3 mL at 11/09/16 2234  . sodium chloride flush (NS) 0.9 % injection 3 mL  3 mL Intravenous PRN Erin FullingKasa, Kurian, MD         Discharge Medications: Please see discharge summary for a list of discharge medications.  Relevant Imaging Results:  Relevant Lab Results:   Additional Information ss: [id number]  York SpanielMonica Kawana Hegel, LCSW

## 2016-11-10 NOTE — Care Management (Signed)
Patient admitted for s/p open appendectomy and umbilical hernia repair.  Patient lives at home with wife.  Adult children live locally for support.  [facility], patient also obtains his medications from there as well.  PT has assessed patient and recommended home health PT.  Patient has RW and cane in the home for ambulation. Patient will require BID dressing changes at discharge.  Patient and family requesting placement at SNF.  CSW notified.  RNCM following

## 2016-11-10 NOTE — Progress Notes (Signed)
11/10/2016  Subjective: Patient is 6 Days Post-Op s/p open appendectomy and umbilical hernia repair.  No acute events.  Patient tolerating diet and having bowel movements.  Pain well controlled.  Vital signs: Temp:  [97.4 F (36.3 C)-98.1 F (36.7 C)] 97.5 F (36.4 C) (07/30 0432) Pulse Rate:  [52-55] 52 (07/30 0432) Resp:  [18-20] 20 (07/30 0432) BP: (152-157)/(52-61) 157/57 (07/30 0432) SpO2:  [94 %-96 %] 95 % (07/30 0432) Weight:  [109.3 kg (240 lb 14.4 oz)] 109.3 kg (240 lb 14.4 oz) (07/30 0500)   Intake/Output: 07/29 0701 - 07/30 0700 In: 720 [P.O.:720] Out: 835 [Urine:300; Drains:535] Last BM Date: 11/09/16  Physical Exam: Constitutional: No acute distress Abdomen:  Soft, nondistended, nontender to palpation.  Incision is clean, dry, and intact with wet to dry dressing. JP with serous fluid in bulb.    Labs:   Recent Labs  11/09/16 0303 11/10/16 0318  WBC 11.9* 10.4  HGB 13.1 12.5*  HCT 39.8* 36.8*  PLT 216 247    Recent Labs  11/08/16 0329 11/09/16 0303  NA 139 137  K 4.1 3.9  CL 105 105  CO2 26 26  GLUCOSE 117* 118*  BUN 20 20  CREATININE 1.01 0.97  CALCIUM 8.3* 8.2*   No results for input(s): LABPROT, INR in the last 72 hours.  Imaging: No results found.  Assessment/Plan: 83 yo male s/p open appendectomy and umbilical hernia repair  --continue dressing changes today --continue working with physical therapy --change abx to oral augmentin --dispo planning for possible SNF   Howie IllJose Luis Sulay Brymer, MD [facility]

## 2016-11-10 NOTE — Progress Notes (Signed)
Physical Therapy Treatment Patient Details Name: Louis Lawson MRN: [id number] DOB: [birth year] Today's Date: 11/10/2016    History of Present Illness pt is a 83 y.o M who was admitted on 11/04/16 with dx of perforated appendcitis and has a hx of CAD, HTN, and irregular heart beat. He underwent a exploratory lapraotomy and appendectomy on 11/04/2016.     PT Comments    Pt is pleasant and willing to participate for return to PLOF. Pt performs bed mobility with minA, tranfers with CGA, and ambulation with CGA-MinA. Pt with total of 80 ft amb, requiring rest break after 40 ft. Pt presents with the following deficits: strength, endurance, sequencing, and balance. Overall, pt responded well to today's treatment with no adverse affects, pt is progressing well towards functional goals. Pt would benefit from skilled PT to address the previously mentioned impairments and promote return to PLOF. Currently recommending HHPT, pending d/c.     Follow Up Recommendations  Home health PT     Equipment Recommendations  Rolling walker with 5" wheels    Recommendations for Other Services       Precautions / Restrictions Precautions Precautions: None Restrictions Weight Bearing Restrictions: No    Mobility  Bed Mobility Overal bed mobility: Needs Assistance Bed Mobility: Supine to Sit;Sit to Supine     Supine to sit: Min assist Sit to supine: Supervision   General bed mobility comments: MinA with supine to sit, requiring HOB elevated, bed rail, and HHA of therapist. Pt supervision with sit to supine, requiring min cues for mechanics.   Transfers Overall transfer level: Needs assistance   Transfers: Sit to/from Stand Sit to Stand: Min guard         General transfer comment: CGA with STS this session, likely secondary to increased lethargy from lack of sleep night before. Pt with good mechanics and safety, requiring only min cues for hand placement.   Ambulation/Gait Ambulation/Gait  assistance: Min guard;Min assist Ambulation Distance (Feet): 80 Feet Assistive device: Rolling walker (2 wheeled) Gait Pattern/deviations: Step-through pattern;Decreased stride length;Antalgic;Trunk flexed     General Gait Details: CGA-MinA, requiring minA during turns, due to increased postural sway. Trunk flexed and with tendency to keep RW far in front. Pt with cues on hand placement on RW, mechnanics, and safety, Pt follows cues well adn demo good carryover. Pt required one seated rest break after 40 ft.    Stairs            Wheelchair Mobility    Modified Rankin (Stroke Patients Only)       Balance                                            Cognition Arousal/Alertness: Lethargic Behavior During Therapy: WFL for tasks assessed/performed Overall Cognitive Status: Within Functional Limits for tasks assessed                                 General Comments: Cont reminders to keep eyes open and stay awake.       Exercises Other Exercises Other Exercises: Supine therex performed in supine position with supervision to B LE's x 12 reps: ankle pumps, heel slides, glute sets, SLR, adn hip abd. Pt with good mechancis, requiring min verbal/visual cues.     General Comments  Pertinent Vitals/Pain Pain Assessment: No/denies pain    Home Living                      Prior Function            PT Goals (current goals can now be found in the care plan section) Acute Rehab PT Goals Patient Stated Goal: to get stronger and go home PT Goal Formulation: With patient Time For Goal Achievement: 12/05/16 Potential to Achieve Goals: Good Progress towards PT goals: Progressing toward goals    Frequency    Min 2X/week      PT Plan Current plan remains appropriate    Co-evaluation              AM-PAC PT "6 Clicks" Daily Activity  Outcome Measure  Difficulty turning over in bed (including adjusting bedclothes,  sheets and blankets)?: Total Difficulty moving from lying on back to sitting on the side of the bed? : Total Difficulty sitting down on and standing up from a chair with arms (e.g., wheelchair, bedside commode, etc,.)?: Total Help needed moving to and from a bed to chair (including a wheelchair)?: A Little Help needed walking in hospital room?: A Little Help needed climbing 3-5 steps with a railing? : A Lot 6 Click Score: 11    End of Session Equipment Utilized During Treatment: Gait belt Activity Tolerance: Patient limited by fatigue Patient left: in bed;with call bell/phone within reach;with bed alarm set Nurse Communication: Mobility status PT Visit Diagnosis: Unsteadiness on feet (R26.81);Muscle weakness (generalized) (M62.81);Other abnormalities of gait and mobility (R26.89);Difficulty in walking, not elsewhere classified (R26.2)     Time: [id number] PT Time Calculation (min) (ACUTE ONLY): 32 min  Charges:                       G Codes:       Louis CheekLaura [name] PT, SPT   Louis MaudlinLaura M [name] 11/10/2016, 11:39 AM

## 2016-11-10 NOTE — Care Management Important Message (Signed)
Important Message  Patient Details  Name: Louis Lawson MRN: [id number] Date of Birth: January 26, 1923   Medicare Important Message Given:  Yes    Chapman FitchBOWEN, Abaigeal Moomaw T, RN 11/10/2016, 11:01 AM

## 2016-11-10 NOTE — Clinical Social Work Note (Signed)
Clinical Social Work Assessment  Patient Details  Name: Louis Lawson MRN: [id number] Date of Birth: Mar 06, 1923  Date of referral:  11/10/16               Reason for consult:  Facility Placement                Permission sought to share information with:  Facility Sport and exercise psychologist, Family Supports Permission granted to share information::  Yes, Verbal Permission Granted  Name::        Agency::     Relationship::     Contact Information:     Housing/Transportation Living arrangements for the past 2 months:  Single Family Home Source of Information:  Adult Children, Spouse Patient Interpreter Needed:  None Criminal Activity/Legal Involvement Pertinent to Current Situation/Hospitalization:  No - Comment as needed Significant Relationships:  Adult Children, Spouse Lives with:  Spouse Do you feel safe going back to the place where you live?  Yes Need for family participation in patient care:  Yes (Comment)  Care giving concerns:  Patient resides at home with his wife but is mobile.  Social Worker assessment / plan:  CSW asked to see patient by RN CM who stated that patient's daughter was insistent that patient be placed into rehab. CSW met with patient, patient's wife, and patient's daughter and son this morning. Patient's family are aware that PT has recommended home health and that patient's dressing changes are wet to dry dressing changes twice a day. CSW has informed the family that patient's insurance: Medicare [employer], may not approve or authorize for patient to go to a skilled facility. If this occurs, patient's family are aware that they would have the option to private pay or take patient back home and provide his care.   CSW has provided patient's referral information to [facility] and they are reviewing patient's case for a determination. CSW conducted a bed search and patient has had a few bed offers. CSW contacted patient's wife via phone this afternoon to extend these  offers. Patient's wife accepted [employer]. CSW to await insurance determination.    Employment status:  Retired Nurse, adult PT Recommendations:  Home with [place] / Referral to community resources:     Patient/Family's Response to care:  Patient's wife expressed appreciation for CSW assistance.  Patient/Family's Understanding of and Emotional Response to Diagnosis, Current Treatment, and Prognosis:  Patient's wife hopeful that patient can be approved for some STR.  Emotional Assessment Appearance:  Appears younger than stated age Attitude/Demeanor/Rapport:   (sleeping at time of assessment) Affect (typically observed):   (sleeping at time of assessment) Orientation:    Alcohol / Substance use:  Not Applicable Psych involvement (Current and /or in the community):  No (Comment)  Discharge Needs  Concerns to be addressed:  Care Coordination Readmission within the last 30 days:  No Current discharge risk:  None Barriers to Discharge:  No Barriers Identified   Shela Leff, LCSW 11/10/2016, 4:06 PM

## 2016-11-10 NOTE — Progress Notes (Signed)
PHARMACIST - PHYSICIAN COMMUNICATION  CONCERNING: IV to Oral Route Change Policy  RECOMMENDATION: This patient is receiving famotidine by the intravenous route.  Based on criteria approved by the Pharmacy and Therapeutics Committee, the intravenous medication(s) is/are being converted to the equivalent oral dose form(s).   DESCRIPTION: These criteria include:  The patient is eating (either orally or via tube) and/or has been taking other orally administered medications for a least 24 hours  The patient has no evidence of active gastrointestinal bleeding or impaired GI absorption (gastrectomy, short bowel, patient on TNA or NPO).  If you have questions about this conversion, please contact the Pharmacy Department  []   [phone]( [phone] )  [facility] [x]   [phone]( [phone] )  [facility] []   [phone]( [phone] )  [facility] []   [phone]( [phone] )  [facility] []   [phone]( [phone] )  [facility]   Marty HeckWang, Jaryiah Mehlman L, [facility] 11/10/2016 7:54 AM

## 2016-11-11 LAB — CBC WITH DIFFERENTIAL/PLATELET
BAND NEUTROPHILS: 1 %
BLASTS: 0 %
Basophils Absolute: 0 10*3/uL (ref 0–0.1)
Basophils Relative: 0 %
EOS ABS: 0 10*3/uL (ref 0–0.7)
Eosinophils Relative: 0 %
HEMATOCRIT: 35.8 % — AB (ref 40.0–52.0)
HEMOGLOBIN: 12 g/dL — AB (ref 13.0–18.0)
LYMPHS PCT: 14 %
Lymphs Abs: 1.4 10*3/uL (ref 1.0–3.6)
MCH: 31.8 pg (ref 26.0–34.0)
MCHC: 33.4 g/dL (ref 32.0–36.0)
MCV: 95.1 fL (ref 80.0–100.0)
MONOS PCT: 9 %
Metamyelocytes Relative: 0 %
Monocytes Absolute: 0.9 10*3/uL (ref 0.2–1.0)
Myelocytes: 0 %
NRBC: 0 /100{WBCs}
Neutro Abs: 7.8 10*3/uL — ABNORMAL HIGH (ref 1.4–6.5)
Neutrophils Relative %: 76 %
OTHER: 0 %
PROMYELOCYTES ABS: 0 %
Platelets: 285 10*3/uL (ref 150–440)
RBC: 3.77 MIL/uL — AB (ref 4.40–5.90)
RDW: 15.8 % — ABNORMAL HIGH (ref 11.5–14.5)
WBC: 10.1 10*3/uL (ref 3.8–10.6)

## 2016-11-11 MED ORDER — AMOXICILLIN-POT CLAVULANATE 875-125 MG PO TABS: 1.0000 | ORAL_TABLET | Freq: Two times a day (BID) | ORAL | 0 refills | Status: AC

## 2016-11-11 MED ORDER — SODIUM CHLORIDE 0.9% FLUSH
40.0000 mL | Freq: Two times a day (BID) | INTRAVENOUS | Status: DC
  Administered 2016-11-11: 40 mL

## 2016-11-11 MED ORDER — HYDROCODONE-ACETAMINOPHEN 5-325 MG PO TABS: 1.0000 | ORAL_TABLET | ORAL | 0 refills | Status: DC | PRN

## 2016-11-11 MED ORDER — ACETAMINOPHEN 325 MG PO TABS: 650.0000 mg | ORAL_TABLET | ORAL | Status: AC | PRN

## 2016-11-11 NOTE — Progress Notes (Signed)
Pts IVs removed per order. Report called to [facility]. EMS transporting pt. Discharge paperwork and prescriptions reviewed with pt and then sent with him via EMS.

## 2016-11-11 NOTE — Care Management (Signed)
Patient discharging to SNF.  RNCM signing off

## 2016-11-11 NOTE — Discharge Summary (Signed)
Patient ID: Louis Lawson MRN: [id number] DOB/AGE: 12/30/1922 83 y.o.  Admit date: 11/04/2016 Discharge date: 11/11/2016   Discharge Diagnoses:  Active Problems:   Perforated appendicitis   Umbilical hernia without obstruction and without gangrene   Respiratory failure (HCC)   Procedures:  Exploratory laparotomy with open appendectomy and umbilical hernia repair.  Hospital Course:  Patient was admitted on 7/24 with perforated appendicitis. He was taken to the operating room that same day and underwent an exploratory laparotomy with open appendectomy and umbilical hernia repair. He tolerated the procedure well. Postoperatively he had a long course and was initially in the ICU and later transferred to the floor. His diet was slowly advanced as tolerated and he was kept on IV fluids until appropriate by mouth intake. He was on IV antibiotics until his white count normalized and was transition to oral antibiotics afterwards. His JP drain has continued to have a significant amount of serous fluid and has been kept in place. There is no evidence of any infection. The wound in the midline has been getting wet-to-dry dressing changes and has been healing appropriately. The patient is having bowel function, has his pain well-controlled, working with physical therapy and has been deemed ready for discharge.  On exam, the patient was in no acute distress with normal stable vital signs. His abdomen was soft, nondistended, appropriately tender to palpation. His midline incision is clean dry and intact with healthy tissue and healing well with wet-to-dry dressing changes. JP drain has serous fluid.  Consults: Physical therapy, critical care  Disposition: SNF  Discharge Instructions    Call MD for:  difficulty breathing, headache or visual disturbances    Complete by:  As directed    Call MD for:  persistant nausea and vomiting    Complete by:  As directed    Call MD for:  redness, tenderness, or signs of  infection (pain, swelling, redness, odor or green/yellow discharge around incision site)    Complete by:  As directed    Call MD for:  severe uncontrolled pain    Complete by:  As directed    Call MD for:  temperature >100.4    Complete by:  As directed    Change dressing (specify)    Complete by:  As directed    Midline incision:  Wet to dry dressing change to midline incision twice daily. Dry gauze dressing to JP drain site.   Diet - low sodium heart healthy    Complete by:  As directed    Discharge instructions    Complete by:  As directed    1.  Patient may shower, but do not scrub wound heavily and dab dry only 2.  Do not submerge wound in pool/tub. 3.  Record JP output daily and empty as needed to maintain appropriate suction.  Bring output record with you to clinic appointment.   Driving Restrictions    Complete by:  As directed    Do not drive while taking narcotics for pain control.   Increase activity slowly    Complete by:  As directed    Lifting restrictions    Complete by:  As directed    No heavy lifting or pushing of more than 10-15 lbs for 4 weeks.     Allergies as of 11/11/2016   No Known Allergies     Medication List    TAKE these medications   acetaminophen 325 MG tablet Commonly known as:  TYLENOL Take 2 tablets (650 mg total)  by mouth every 4 (four) hours as needed for mild pain (temp > 101.5).   allopurinol 300 MG tablet Commonly known as:  ZYLOPRIM Take 1 tablet by mouth daily.   amLODipine 10 MG tablet Commonly known as:  NORVASC Take 1 tablet by mouth daily.   amoxicillin-clavulanate 875-125 MG tablet Commonly known as:  AUGMENTIN Take 1 tablet by mouth every 12 (twelve) hours.   cholecalciferol 1000 units tablet Commonly known as:  VITAMIN D Take 1,000 Units by mouth daily.   furosemide 40 MG tablet Commonly known as:  LASIX Take 40 mg by mouth daily.   HYDROcodone-acetaminophen 5-325 MG tablet Commonly known as:  NORCO/VICODIN Take  1 tablet by mouth every 4 (four) hours as needed for moderate pain.   metoprolol succinate 50 MG 24 hr tablet Commonly known as:  TOPROL-XL Take 50 mg by mouth daily. Take with or immediately following a meal.   ramipril 10 MG capsule Commonly known as:  ALTACE Take 10 mg by mouth 2 (two) times daily.   simvastatin 20 MG tablet Commonly known as:  ZOCOR Take 20 mg by mouth daily.       Contact information for follow-up providers    Lawson, HawaiiDiego F, MD Follow up in 1 week(s).   Specialty:  General Surgery Why:  For possible JP drain removal. Contact information: [address] [place] [address] [phone]            Contact information for after-discharge care    Destination    [facility].   Specialty:  Skilled Nursing Chief of Staffacility Contact information: [address] [place] [phone]

## 2016-11-11 NOTE — Clinical Social Work Note (Signed)
Patient discharging today to go to [facility]. Patient's wife and family are aware of discharge and in agreement. Patient's wife and family have chosen for patient to go via EMS even though CSW has informed them that patient has no medical need to go via EMS and that they will end up paying out of pocket more than likely. Discharge information sent to [facility]. York SpanielMonica Melane Windholz MSW,LCSW [phone]

## 2016-11-11 NOTE — Clinical Social Work Placement (Signed)
   CLINICAL SOCIAL WORK PLACEMENT  NOTE  Date:  11/11/2016  Patient Details  Name: Louis Lawson MRN: [id number] Date of Birth: 1922/11/10  Clinical Social Work is seeking post-discharge placement for this patient at the Skilled  Nursing Facility level of care (*CSW will initial, date and re-position this form in  chart as items are completed):  Yes   Patient/family provided with [place] Clinical Social Work Department's list of facilities offering this level of care within the geographic area requested by the patient (or if unable, by the patient's family).  Yes   Patient/family informed of their freedom to choose among providers that offer the needed level of care, that participate in Medicare, Medicaid or managed care program needed by the patient, have an available bed and are willing to accept the patient.  Yes   Patient/family informed of [facility] ownership interest in [facility] and [facility], as well as of the fact that they are under no obligation to receive care at these facilities.  PASRR submitted to EDS on 11/10/16     PASRR number received on 11/10/16     Existing PASRR number confirmed on       FL2 transmitted to all facilities in geographic area requested by pt/family on 11/10/16     FL2 transmitted to all facilities within larger geographic area on       Patient informed that his/her managed care company has contracts with or will negotiate with certain facilities, including the following:        Yes   Patient/family informed of bed offers received.  [facility]  [facility]     Physician recommends and patient chooses bed at  [facility]    Patient to be transferred to  [facility] on 11/11/16.  Patient to be transferred to facility by  (EMS)     Patient family notified on 11/11/16 of transfer.  Name of family member notified:   (patient's wife and daughter)     PHYSICIAN       Additional Comment:     _______________________________________________ York SpanielMonica Shaylynne Lunt, LCSW 11/11/2016, 2:42 PM

## 2016-11-11 NOTE — Clinical Social Work Note (Signed)
CSW received call from [facility] with authorization for patient to go to [facility] today to [facility].  York SpanielMonica Atia Haupt MSW,LCSW [phone]

## 2016-11-13 ENCOUNTER — Telehealth: Payer: Self-pay

## 2016-11-13 ENCOUNTER — Other Ambulatory Visit
Admission: RE | Admit: 2016-11-13 | Discharge: 2016-11-13 | Disposition: A | Discharge status: Home / Self Care | Payer: Medicare HMO | Source: Ambulatory Visit | Attending: Family Medicine | Admitting: Family Medicine

## 2016-11-13 DIAGNOSIS — Z110 Encounter for screening for intestinal infectious diseases: Secondary | ICD-10-CM | POA: Diagnosis present

## 2016-11-13 LAB — CBC WITH DIFFERENTIAL/PLATELET
Basophils Absolute: 0 10*3/uL (ref 0–0.1)
Basophils Relative: 0 %
EOS ABS: 0.1 10*3/uL (ref 0–0.7)
Eosinophils Relative: 1 %
HEMATOCRIT: 36.5 % — AB (ref 40.0–52.0)
HEMOGLOBIN: 12.2 g/dL — AB (ref 13.0–18.0)
LYMPHS ABS: 1.5 10*3/uL (ref 1.0–3.6)
LYMPHS PCT: 14 %
MCH: 31.8 pg (ref 26.0–34.0)
MCHC: 33.4 g/dL (ref 32.0–36.0)
MCV: 95.3 fL (ref 80.0–100.0)
MONOS PCT: 12 %
Monocytes Absolute: 1.3 10*3/uL — ABNORMAL HIGH (ref 0.2–1.0)
NEUTROS ABS: 7.8 10*3/uL — AB (ref 1.4–6.5)
NEUTROS PCT: 73 %
Platelets: 378 10*3/uL (ref 150–440)
RBC: 3.83 MIL/uL — AB (ref 4.40–5.90)
RDW: 15.6 % — ABNORMAL HIGH (ref 11.5–14.5)
WBC: 10.7 10*3/uL — AB (ref 3.8–10.6)

## 2016-11-13 LAB — COMPREHENSIVE METABOLIC PANEL
ALT: 57 U/L (ref 17–63)
ANION GAP: 6 (ref 5–15)
AST: 67 U/L — ABNORMAL HIGH (ref 15–41)
Albumin: 2.4 g/dL — ABNORMAL LOW (ref 3.5–5.0)
Alkaline Phosphatase: 65 U/L (ref 38–126)
BUN: 23 mg/dL — ABNORMAL HIGH (ref 6–20)
CHLORIDE: 103 mmol/L (ref 101–111)
CO2: 28 mmol/L (ref 22–32)
CREATININE: 1.47 mg/dL — AB (ref 0.61–1.24)
Calcium: 8.6 mg/dL — ABNORMAL LOW (ref 8.9–10.3)
GFR calc Af Amer: 46 mL/min — ABNORMAL LOW (ref >60)
GFR, EST NON AFRICAN AMERICAN: 40 mL/min — AB (ref >60)
Glucose, Bld: 119 mg/dL — ABNORMAL HIGH (ref 65–99)
POTASSIUM: 4.7 mmol/L (ref 3.5–5.1)
SODIUM: 137 mmol/L (ref 135–145)
Total Bilirubin: 0.4 mg/dL (ref 0.3–1.2)
Total Protein: 5.7 g/dL — ABNORMAL LOW (ref 6.5–8.1)

## 2016-11-13 NOTE — Telephone Encounter (Signed)
Post-op call made to patient at this time. Was unable to leave a message due to voicemailbox not being set up at this time.

## 2016-11-14 ENCOUNTER — Other Ambulatory Visit
Admission: RE | Admit: 2016-11-14 | Discharge: 2016-11-14 | Disposition: A | Discharge status: Home / Self Care | Payer: Medicare HMO | Source: Ambulatory Visit | Attending: Specialist | Admitting: Specialist

## 2016-11-14 DIAGNOSIS — R197 Diarrhea, unspecified: Secondary | ICD-10-CM | POA: Diagnosis present

## 2016-11-14 LAB — C DIFFICILE QUICK SCREEN W PCR REFLEX
C DIFFICILE (CDIFF) INTERP: NOT DETECTED
C DIFFICLE (CDIFF) ANTIGEN: NEGATIVE
C Diff toxin: NEGATIVE

## 2016-11-19 ENCOUNTER — Ambulatory Visit
Admission: RE | Admit: 2016-11-19 | Discharge: 2016-11-19 | Disposition: A | Discharge status: Home / Self Care | Payer: Medicare HMO | Source: Ambulatory Visit | Attending: Family Medicine | Admitting: Family Medicine

## 2016-11-19 ENCOUNTER — Ambulatory Visit
Admission: RE | Admit: 2016-11-19 | Discharge: 2016-11-19 | Disposition: A | Discharge status: Home / Self Care | Payer: Medicare HMO | Source: Ambulatory Visit | Attending: General Surgery | Admitting: General Surgery

## 2016-11-19 ENCOUNTER — Other Ambulatory Visit: Payer: Self-pay | Admitting: General Surgery

## 2016-11-19 ENCOUNTER — Ambulatory Visit (INDEPENDENT_AMBULATORY_CARE_PROVIDER_SITE_OTHER): Payer: Medicare HMO | Admitting: General Surgery

## 2016-11-19 VITALS — BP 149/75 | HR 53 | Temp 97.8°F | Ht 71.0 in | Wt 240.0 lb

## 2016-11-19 DIAGNOSIS — Z9889 Other specified postprocedural states: Secondary | ICD-10-CM | POA: Diagnosis not present

## 2016-11-19 DIAGNOSIS — R140 Abdominal distension (gaseous): Secondary | ICD-10-CM | POA: Diagnosis present

## 2016-11-19 DIAGNOSIS — Z4889 Encounter for other specified surgical aftercare: Secondary | ICD-10-CM

## 2016-11-20 ENCOUNTER — Encounter: Payer: Self-pay | Admitting: General Surgery

## 2016-11-20 NOTE — Patient Instructions (Signed)
We have ordered an abdominal X-ray today. Please have this done in the medical mall at [facility] and we will call you with results.  Directions to Medical Mall: When leaving our office, go right. Go all of the way down to the very end of the hallway. You will have a purple wall in front of you. You will now have a tunnel to the hospital on your left hand side. Go through this tunnel and the elevators will be on your left. Go down to the 1st floor and take a slight left. The very first desk on the right hand side is the registration desk.  Follow-up as scheduled below.

## 2016-11-20 NOTE — Progress Notes (Signed)
Outpatient Surgical Follow Up  11/20/2016  Louis Lawson is an 83 y.o. male.   Chief Complaint  Patient presents with  . Routine Post Op    Laparotomy with Appendectomy and Umbilical Hernia Repair (11/04/16)- Dr. Everlene Farrier    HPI: 83 year old male returns to clinic for follow-up now 2 weeks status post laparotomy for ruptured appendicitis. Patient is still in a rehabilitation facility. He has had some nausea and vomiting since discharge but reports tolerating his diet today. He is having bowel function. He still has a drain in place and does not have any record of output. He denies any recent fevers, chills, chest pain, shortness breath, diarrhea, constipation. They're performing twice daily dressing changes to his open midline wound.  Past Medical History:  Diagnosis Date  . Coronary artery disease   . Hypertension   . Irregular heart beat     Past Surgical History:  Procedure Laterality Date  . APPENDECTOMY N/A 11/04/2016   Procedure: APPENDECTOMY;  Surgeon: Leafy Ro, MD;  Location: [facility] ORS;  Service: General;  Laterality: N/A;  . LAPAROTOMY N/A 11/04/2016   Procedure: EXPLORATORY LAPAROTOMY;  Surgeon: Leafy Ro, MD;  Location: [facility] ORS;  Service: General;  Laterality: N/A;  . UMBILICAL HERNIA REPAIR N/A 11/04/2016   Procedure: HERNIA REPAIR UMBILICAL ADULT;  Surgeon: Leafy Ro, MD;  Location: [facility] ORS;  Service: General;  Laterality: N/A;  . VASCULAR SURGERY Right    Lower Extremity- secondary to DVT    Family History  Problem Relation Age of Onset  . Hypertension Sister     Social History:  reports that he quit smoking about 52 years ago. He has never used smokeless tobacco. He reports that he drinks alcohol. He reports that he does not use drugs.  Allergies: No Known Allergies  Medications reviewed.    ROS A multipoint review of systems was completed, all pertinent positives and negatives are documented within the history of present illness and remainder are  negative   BP (!) 149/75   Pulse (!) 53   Temp 97.8 F (36.6 C) (Oral)   Ht 5\' 11"  (1.803 m)   Wt 108.9 kg (240 lb) Comment: Per Nursing Home Log- Patient has difficulty standing  BMI 33.47 kg/m   Physical Exam Gen.: No acute distress Chest: Clear to auscultation  heart: Regular rhythm Abdomen: Soft, moderately distended, appropriately tender to palpation in the midline. Midline wound with healthy-appearing granulation tissue. JP drain in the right lower quadrant with a serosanguineous output.    No results found for this or any previous visit (from the past 48 hour(s)). Dg Abd 1 View  Result Date: 11/20/2016 CLINICAL DATA:  Abdominal distention. History of ruptured appendicitis and appendectomy 11/04/2016. EXAM: ABDOMEN - 1 VIEW COMPARISON:  Plain films and CT abdomen and pelvis 11/04/2016. FINDINGS: Surgical drain is in place in the right lower quadrant. No free intraperitoneal air is identified. There is moderate gaseous distention of the stomach and scattered loops of small bowel. Large volume of stool in the ascending colon is noted. IMPRESSION: Moderate gaseous distention of the stomach in scattered loops of small bowel may be due to mild ileus. Large stool burden ascending colon. Surgical drain in the right lower quadrant. Electronically Signed   By: Drusilla Kanner M.D.   On: 11/20/2016 08:56    Assessment/Plan:  1. Aftercare following surgery 83 year old male status post laparotomy for ruptured appendicitis. Recovering in rehabilitation. Discussed continuing local wound care and JP drain for now. Given his intermittent  nausea and vomiting discussed with the patient and the family should his nausea and vomiting continue that he would need to come back to the hospital. Plain film x-ray obtained today which shows gaseous distention of the proximal small bowel and large: Stool burden however no evidence of rupture or overt obstruction. Patient will follow-up in clinic next week with  Dr. Everlene FarrierPabon for continued follow-up.     Ricarda Frameharles Audria Takeshita, MD FACS General Surgeon  11/20/2016,11:42 AM

## 2016-11-24 ENCOUNTER — Emergency Department
Admission: EM | Admit: 2016-11-24 | Discharge: 2016-11-24 | Disposition: A | Discharge status: Skilled Nursing Facility | Payer: Medicare HMO | Attending: Emergency Medicine | Admitting: Emergency Medicine

## 2016-11-24 ENCOUNTER — Encounter: Payer: Self-pay | Admitting: Emergency Medicine

## 2016-11-24 ENCOUNTER — Telehealth: Payer: Self-pay

## 2016-11-24 ENCOUNTER — Emergency Department: Payer: Medicare HMO

## 2016-11-24 DIAGNOSIS — K8020 Calculus of gallbladder without cholecystitis without obstruction: Secondary | ICD-10-CM | POA: Insufficient documentation

## 2016-11-24 DIAGNOSIS — I10 Essential (primary) hypertension: Secondary | ICD-10-CM | POA: Insufficient documentation

## 2016-11-24 DIAGNOSIS — R110 Nausea: Secondary | ICD-10-CM | POA: Diagnosis not present

## 2016-11-24 DIAGNOSIS — Z79899 Other long term (current) drug therapy: Secondary | ICD-10-CM | POA: Diagnosis not present

## 2016-11-24 DIAGNOSIS — Z87891 Personal history of nicotine dependence: Secondary | ICD-10-CM | POA: Diagnosis not present

## 2016-11-24 DIAGNOSIS — I2510 Atherosclerotic heart disease of native coronary artery without angina pectoris: Secondary | ICD-10-CM | POA: Diagnosis not present

## 2016-11-24 DIAGNOSIS — R140 Abdominal distension (gaseous): Secondary | ICD-10-CM | POA: Diagnosis not present

## 2016-11-24 LAB — BASIC METABOLIC PANEL
Anion gap: 8 (ref 5–15)
BUN: 18 mg/dL (ref 6–20)
CALCIUM: 8.6 mg/dL — AB (ref 8.9–10.3)
CO2: 25 mmol/L (ref 22–32)
CREATININE: 1.11 mg/dL (ref 0.61–1.24)
Chloride: 103 mmol/L (ref 101–111)
GFR, EST NON AFRICAN AMERICAN: 56 mL/min — AB (ref >60)
GLUCOSE: 137 mg/dL — AB (ref 65–99)
Potassium: 3.8 mmol/L (ref 3.5–5.1)
Sodium: 136 mmol/L (ref 135–145)

## 2016-11-24 LAB — CBC WITH DIFFERENTIAL/PLATELET
BASOS ABS: 0 10*3/uL (ref 0–0.1)
BASOS PCT: 0 %
EOS ABS: 0.2 10*3/uL (ref 0–0.7)
EOS PCT: 2 %
HCT: 38.1 % — ABNORMAL LOW (ref 40.0–52.0)
Hemoglobin: 12.7 g/dL — ABNORMAL LOW (ref 13.0–18.0)
Lymphocytes Relative: 21 %
Lymphs Abs: 1.8 10*3/uL (ref 1.0–3.6)
MCH: 31 pg (ref 26.0–34.0)
MCHC: 33.2 g/dL (ref 32.0–36.0)
MCV: 93.3 fL (ref 80.0–100.0)
MONO ABS: 1.1 10*3/uL — AB (ref 0.2–1.0)
Monocytes Relative: 12 %
Neutro Abs: 5.9 10*3/uL (ref 1.4–6.5)
Neutrophils Relative %: 65 %
PLATELETS: 296 10*3/uL (ref 150–440)
RBC: 4.08 MIL/uL — ABNORMAL LOW (ref 4.40–5.90)
RDW: 14.8 % — AB (ref 11.5–14.5)
WBC: 9 10*3/uL (ref 3.8–10.6)

## 2016-11-24 LAB — HEPATIC FUNCTION PANEL
ALT: 29 U/L (ref 17–63)
AST: 44 U/L — ABNORMAL HIGH (ref 15–41)
Albumin: 3 g/dL — ABNORMAL LOW (ref 3.5–5.0)
Alkaline Phosphatase: 61 U/L (ref 38–126)
BILIRUBIN DIRECT: 0.1 mg/dL (ref 0.1–0.5)
BILIRUBIN INDIRECT: 0.5 mg/dL (ref 0.3–0.9)
BILIRUBIN TOTAL: 0.6 mg/dL (ref 0.3–1.2)
Total Protein: 7.1 g/dL (ref 6.5–8.1)

## 2016-11-24 LAB — URINALYSIS, COMPLETE (UACMP) WITH MICROSCOPIC
Bacteria, UA: NONE SEEN
Bilirubin Urine: NEGATIVE
Glucose, UA: NEGATIVE mg/dL
HGB URINE DIPSTICK: NEGATIVE
Ketones, ur: NEGATIVE mg/dL
LEUKOCYTES UA: NEGATIVE
NITRITE: NEGATIVE
PH: 5 (ref 5.0–8.0)
Protein, ur: NEGATIVE mg/dL
SPECIFIC GRAVITY, URINE: 1.008 (ref 1.005–1.030)

## 2016-11-24 LAB — LIPASE, BLOOD: LIPASE: 55 U/L — AB (ref 11–51)

## 2016-11-24 LAB — LACTIC ACID, PLASMA: LACTIC ACID, VENOUS: 1.7 mmol/L (ref 0.5–1.9)

## 2016-11-24 LAB — TROPONIN I

## 2016-11-24 MED ORDER — ONDANSETRON 4 MG PO TBDP: 4.0000 mg | ORAL_TABLET | Freq: Three times a day (TID) | ORAL | 0 refills | Status: AC | PRN

## 2016-11-24 MED ORDER — IOPAMIDOL (ISOVUE-300) INJECTION 61%
100.0000 mL | Freq: Once | INTRAVENOUS | Status: AC | PRN
  Administered 2016-11-24: 100 mL via INTRAVENOUS

## 2016-11-24 MED ORDER — ONDANSETRON HCL 4 MG/2ML IJ SOLN
4.0000 mg | Freq: Once | INTRAMUSCULAR | Status: AC
  Administered 2016-11-24: 4 mg via INTRAVENOUS
  Filled 2016-11-24: qty 2

## 2016-11-24 NOTE — ED Notes (Signed)
Patient transported to CT 

## 2016-11-24 NOTE — Discharge Instructions (Signed)
Fortunately today your CT scan was reassuring in your blood work was a very normal. Please make an appointment to follow-up with your general surgeon Dr. Everlene Farrier this week for reevaluation. Return to the emergency department for any concerns whatsoever such as if you cannot eat or drink, fevers, chills, or for any other concerns.  It was a pleasure to take care of you today, and thank you for coming to our emergency department.  If you have any questions or concerns before leaving please ask the nurse to grab me and I'm more than happy to go through your aftercare instructions again.  If you were prescribed any opioid pain medication today such as Norco, Vicodin, Percocet, morphine, hydrocodone, or oxycodone please make sure you do not drive when you are taking this medication as it can alter your ability to drive safely.  If you have any concerns once you are home that you are not improving or are in fact getting worse before you can make it to your follow-up appointment, please do not hesitate to call 911 and come back for further evaluation.  Merrily Brittle, MD  Results for orders placed or performed during the hospital encounter of 11/24/16  Basic metabolic panel  Result Value Ref Range   Sodium 136 135 - 145 mmol/L   Potassium 3.8 3.5 - 5.1 mmol/L   Chloride 103 101 - 111 mmol/L   CO2 25 22 - 32 mmol/L   Glucose, Bld 137 (H) 65 - 99 mg/dL   BUN 18 6 - 20 mg/dL   Creatinine, Ser 1.61 0.61 - 1.24 mg/dL   Calcium 8.6 (L) 8.9 - 10.3 mg/dL   GFR calc non Af Amer 56 (L) >60 mL/min   GFR calc Af Amer >60 >60 mL/min   Anion gap 8 5 - 15  Hepatic function panel  Result Value Ref Range   Total Protein 7.1 6.5 - 8.1 g/dL   Albumin 3.0 (L) 3.5 - 5.0 g/dL   AST 44 (H) 15 - 41 U/L   ALT 29 17 - 63 U/L   Alkaline Phosphatase 61 38 - 126 U/L   Total Bilirubin 0.6 0.3 - 1.2 mg/dL   Bilirubin, Direct 0.1 0.1 - 0.5 mg/dL   Indirect Bilirubin 0.5 0.3 - 0.9 mg/dL  Lipase, blood  Result Value Ref  Range   Lipase 55 (H) 11 - 51 U/L  Troponin I  Result Value Ref Range   Troponin I <0.03 <0.03 ng/mL  CBC with Differential  Result Value Ref Range   WBC 9.0 3.8 - 10.6 K/uL   RBC 4.08 (L) 4.40 - 5.90 MIL/uL   Hemoglobin 12.7 (L) 13.0 - 18.0 g/dL   HCT 09.6 (L) 04.5 - 40.9 %   MCV 93.3 80.0 - 100.0 fL   MCH 31.0 26.0 - 34.0 pg   MCHC 33.2 32.0 - 36.0 g/dL   RDW 81.1 (H) 91.4 - 78.2 %   Platelets 296 150 - 440 K/uL   Neutrophils Relative % 65 %   Neutro Abs 5.9 1.4 - 6.5 K/uL   Lymphocytes Relative 21 %   Lymphs Abs 1.8 1.0 - 3.6 K/uL   Monocytes Relative 12 %   Monocytes Absolute 1.1 (H) 0.2 - 1.0 K/uL   Eosinophils Relative 2 %   Eosinophils Absolute 0.2 0 - 0.7 K/uL   Basophils Relative 0 %   Basophils Absolute 0.0 0 - 0.1 K/uL  Urinalysis, Complete w Microscopic  Result Value Ref Range   Color, Urine YELLOW (  A) YELLOW   APPearance CLEAR (A) CLEAR   Specific Gravity, Urine 1.008 1.005 - 1.030   pH 5.0 5.0 - 8.0   Glucose, UA NEGATIVE NEGATIVE mg/dL   Hgb urine dipstick NEGATIVE NEGATIVE   Bilirubin Urine NEGATIVE NEGATIVE   Ketones, ur NEGATIVE NEGATIVE mg/dL   Protein, ur NEGATIVE NEGATIVE mg/dL   Nitrite NEGATIVE NEGATIVE   Leukocytes, UA NEGATIVE NEGATIVE   RBC / HPF 0-5 0 - 5 RBC/hpf   WBC, UA 0-5 0 - 5 WBC/hpf   Bacteria, UA NONE SEEN NONE SEEN   Squamous Epithelial / LPF 0-5 (A) NONE SEEN   Mucous PRESENT    Hyaline Casts, UA PRESENT   Lactic acid, plasma  Result Value Ref Range   Lactic Acid, Venous 1.7 0.5 - 1.9 mmol/L   Dg Abd 1 View  Result Date: 11/20/2016 CLINICAL DATA:  Abdominal distention. History of ruptured appendicitis and appendectomy 11/04/2016. EXAM: ABDOMEN - 1 VIEW COMPARISON:  Plain films and CT abdomen and pelvis 11/04/2016. FINDINGS: Surgical drain is in place in the right lower quadrant. No free intraperitoneal air is identified. There is moderate gaseous distention of the stomach and scattered loops of small bowel. Large volume of stool  in the ascending colon is noted. IMPRESSION: Moderate gaseous distention of the stomach in scattered loops of small bowel may be due to mild ileus. Large stool burden ascending colon. Surgical drain in the right lower quadrant. Electronically Signed   By: Drusilla Kanner M.D.   On: 11/20/2016 08:56   Dg Abdomen 1 View  Result Date: 11/04/2016 CLINICAL DATA:  Abdominal pain. EXAM: ABDOMEN - 1 VIEW COMPARISON:  No recent . FINDINGS: Distended loops of bowel noted in the mid abdomen possibly small bowel. No colonic distention. Partial small-bowel obstruction cannot be excluded. No free air. IMPRESSION: Distended loops of small bowel with normal colonic gas pattern. Partial small-bowel obstruction cannot be excluded. Follow-up exam suggested. Electronically Signed   By: Maisie Fus  Register   On: 11/04/2016 11:16   Ct Abdomen Pelvis W Contrast  Result Date: 11/24/2016 CLINICAL DATA:  Nausea and diminished appetite since yesterday, abdominal distension, post appendectomy on 11/04/2016, has a drain EXAM: CT ABDOMEN AND PELVIS WITH CONTRAST TECHNIQUE: Multidetector CT imaging of the abdomen and pelvis was performed using the standard protocol following bolus administration of intravenous contrast. Sagittal and coronal MPR images reconstructed from axial data set. CONTRAST:  ISOVUE-300 IOPAMIDOL (ISOVUE-300) INJECTION 61% IV. No oral contrast. COMPARISON:  11/04/2016 FINDINGS: Lower chest: Dependent atelectasis LEFT lung base. Hepatobiliary: Dependent calcified gallstones in gallbladder. No biliary dilatation or focal hepatic abnormality Pancreas: Normal appearance Spleen: Normal appearance Adrenals/Urinary Tract: Stable LEFT adrenal nodule 14 x 11 mm image 32 demonstrating adequate washout on delayed images compatible with adenoma. Unremarkable RIGHT adrenal gland. Large cyst upper pole RIGHT kidney 4.6 x 4.4 cm image 42. Large cyst inferior pole RIGHT kidney 6.2 x 5.0 cm image 60. No additional renal mass,  hydronephrosis, or ureteral dilatation. Bladder unremarkable. Stomach/Bowel: Fluid within distal colon and rectum. Gas distention of sigmoid colon and portion of transverse colon. Nonobstructive bowel gas pattern. Stomach unremarkable. No definite bowel wall thickening. Vascular/Lymphatic: Atherosclerotic calcifications aorta and iliac arteries, minimally in coronary arteries. Scattered reactive retroperitoneal nodes. Reproductive: N/A Other: Percutaneous drain coiled in the small bowel mesenteric. No definite residual abscess collection. No free air or ascites. No hernia. Scattered areas of stranding of intra-abdominal tissue planes particularly in the RIGHT mid abdomen and RIGHT pelvis compatible with prior appendicitis  and surgery. Musculoskeletal: Degenerative disc disease changes greatest at L3-L4. IMPRESSION: No evidence of abscess. Mild gas distention of sigmoid loop and mid transverse colon without definite evidence of bowel obstruction. No new intra-abdominal or intrapelvic abnormalities. Cholelithiasis. Stable LEFT adrenal adenoma. Aortic Atherosclerosis (ICD10-I70.0). Electronically Signed   By: Ulyses Southward M.D.   On: 11/24/2016 16:36   Ct Abdomen Pelvis W Contrast  Result Date: 11/04/2016 CLINICAL DATA:  Abdominal pain for 2 days weakness, fever, tachycardia, sepsis, history hypertension, coronary artery disease, former smoker EXAM: CT ABDOMEN AND PELVIS WITH CONTRAST TECHNIQUE: Multidetector CT imaging of the abdomen and pelvis was performed using the standard protocol following bolus administration of intravenous contrast. Sagittal and coronal MPR images reconstructed from axial data set. CONTRAST:  75mL ISOVUE-300 IOPAMIDOL (ISOVUE-300) INJECTION 61% IV. Dilute oral contrast. COMPARISON:  None FINDINGS: Lower chest: Minimal RIGHT basilar atelectasis. Questionable 4 mm lingular nodule image 2. Lung bases otherwise clear. Hepatobiliary: Depending calculi in gallbladder up to 12 mm diameter. No  gallbladder wall thickening or biliary dilatation. Liver unremarkable. Pancreas: Normal appearance Spleen: Normal appearance Adrenals/Urinary Tract: LEFT adrenal nodule 18 x 12 mm question tiny adenoma RIGHT adrenal gland unremarkable. Two RIGHT renal cysts measuring 6.0 x 4.8 cm at inferior pole and 4.5 x 4.4 cm at upper pole. No urinary tract calcification, hydronephrosis, ureteral dilatation, or additional renal mass lesion. Minimal bladder wall thickening which could be related to chronic outlet obstruction in patient with mild prostatic enlargement, gland 5.3 x 3.8 cm image 86. Stomach/Bowel: Short segment of normal proximal appendix visualized, distally not well localized. Stomach decompressed. Colon unremarkable. Multiple dilated small bowel loops in mid abdomen with significant infiltrative/inflammatory changes in the small bowel mesenteries. A few tiny foci of gas are identified adjacent to thickened small bowel loops within this collection likely representing foci of extra intestinal gas and reflecting localized small bowel perforation, doubt tiny small bowel diverticulum with none additional seen elsewhere. No definite abscess collection. Vascular/Lymphatic: Atherosclerotic calcifications aorta with minimal aneurysmal dilatation of the distal abdominal aorta to a greatest size of 3.0 x 2.6 cm. Additional atherosclerotic calcifications in the iliac and femoral arteries as well as coronary arteries. No adenopathy. Reproductive: N/A Other: Tiny umbilical hernia containing fat and fluid. No free intraperitoneal air. Small amount of free fluid in pelvis. Musculoskeletal: Discogenic sclerosis at L4-L5. Scattered degenerative disc and facet disease changes lumbar spine. Degenerative changes of the hips. Fatty atrophy of the paraspinal muscles. IMPRESSION: Small bowel inflammatory process in the mid abdomen could reflect a severe enteritis with several tiny foci of suspected extraluminal gas adjacent to small  bowel loops in this region, highly suspicious for localized perforation either on the basis of enteritis or foreign body perforation. Minimal free intraperitoneal fluid without free air. Some of the observed small bowel loops are mildly dilated which could be related to regional ileus or mild degree of obstruction. Minimal aneurysmal dilatation of the distal abdominal aorta 3.0 x 2.6 cm. Cholelithiasis. RIGHT renal cysts. Aortic aneurysm NOS (ICD10-I71.9). Aortic Atherosclerosis (ICD10-I70.0). Findings called to Dr. Cyril Loosen on 11/04/2016 at 1318 hrs. Electronically Signed   By: Ulyses Southward M.D.   On: 11/04/2016 13:19   Dg Chest Port 1 View  Result Date: 11/05/2016 CLINICAL DATA:  Respiratory failure . EXAM: PORTABLE CHEST 1 VIEW COMPARISON:  11/04/2016 . FINDINGS: Endotracheal tube tip noted approximately 6 cm above the carina. Heart size stable. Persistent bibasilar atelectasis. Right base infiltrate cannot be excluded. Tiny right pleural effusion. No pneumothorax. IMPRESSION: 1. Endotracheal  tube tip noted approximately 6 cm above the carina. 2. Persistent unchanged bibasilar atelectasis. Right base infiltrate cannot be excluded . Electronically Signed   By: Maisie Fushomas  Register   On: 11/05/2016 06:50   Dg Chest Port 1 View  Result Date: 11/04/2016 CLINICAL DATA:  Perforated appendix, acute respiratory failure EXAM: PORTABLE CHEST 1 VIEW COMPARISON:  11/04/2016 FINDINGS: Interim placement of endotracheal tube, the tip is about 4.5 cm superior to the carina. Minimal basilar atelectasis. Stable enlarged cardiomediastinal with atherosclerosis. No consolidation or effusion. No pneumothorax. IMPRESSION: 1. Endotracheal tube tip about 4.5 cm superior to carina 2. Stable cardiomegaly 3. Minimal basilar atelectasis Electronically Signed   By: Jasmine PangKim  Fujinaga M.D.   On: 11/04/2016 18:54   Dg Chest Port 1 View  Result Date: 11/04/2016 CLINICAL DATA:  Abdominal pain for 2 days.  Weakness, fever EXAM: PORTABLE CHEST 1  VIEW COMPARISON:  04/07/2015 FINDINGS: Heart is borderline in size. Lungs are clear. No effusions or acute bony abnormality. IMPRESSION: No active disease. Electronically Signed   By: Charlett NoseKevin  Dover M.D.   On: 11/04/2016 11:16

## 2016-11-24 NOTE — ED Notes (Signed)
Pt provided graham crackers, peanut butter and a coke upon request.

## 2016-11-24 NOTE — ED Provider Notes (Signed)
[facility] Emergency Department Provider Note  ____________________________________________   First MD Initiated Contact with Patient 11/24/16 1454     (approximate)  I have reviewed the triage vital signs and the nursing notes.   HISTORY  Chief Complaint Nausea    HPI Louis Lawson is a 83 y.o. male who comes to the emergency department via EMS from his nursing home for nausea and decreased appetite. History is challenging to obtain as the patient is a very poor historian. He said that roughly 3 weeks ago he had some sort of abdominal surgery and on chart review he had a perforated appendix that was repaired with a laparotomy and appendectomy as well as an umbilical hernia repair.   Past Medical History:  Diagnosis Date  . Coronary artery disease   . Hypertension   . Irregular heart beat     Patient Active Problem List   Diagnosis Date Noted  . Perforated appendicitis 11/04/2016  . Respiratory failure (HCC) 11/04/2016  . Small bowel perforation (HCC)   . Umbilical hernia without obstruction and without gangrene     Past Surgical History:  Procedure Laterality Date  . APPENDECTOMY N/A 11/04/2016   Procedure: APPENDECTOMY;  Surgeon: Leafy RoPabon, Diego F, MD;  Location: [facility] ORS;  Service: General;  Laterality: N/A;  . LAPAROTOMY N/A 11/04/2016   Procedure: EXPLORATORY LAPAROTOMY;  Surgeon: Leafy RoPabon, Diego F, MD;  Location: [facility] ORS;  Service: General;  Laterality: N/A;  . UMBILICAL HERNIA REPAIR N/A 11/04/2016   Procedure: HERNIA REPAIR UMBILICAL ADULT;  Surgeon: Leafy RoPabon, Diego F, MD;  Location: [facility] ORS;  Service: General;  Laterality: N/A;  . VASCULAR SURGERY Right    Lower Extremity- secondary to DVT    Prior to Admission medications   Medication Sig Start Date End Date Taking? Authorizing Provider  acetaminophen (TYLENOL) 325 MG tablet Take 2 tablets (650 mg total) by mouth every 4 (four) hours as needed for mild pain (temp > 101.5). 11/11/16   Henrene DodgePiscoya,  Jose, MD  allopurinol (ZYLOPRIM) 300 MG tablet Take 1 tablet by mouth daily. 02/27/16   [provider]  amLODipine (NORVASC) 10 MG tablet Take 1 tablet by mouth daily. 02/27/16   [provider]  cholecalciferol (VITAMIN D) 1000 units tablet Take 1,000 Units by mouth daily.     [provider]  furosemide (LASIX) 40 MG tablet Take 40 mg by mouth daily.     [provider]  HYDROcodone-acetaminophen (NORCO/VICODIN) 5-325 MG tablet Take 1 tablet by mouth every 4 (four) hours as needed for moderate pain. 11/11/16   Henrene DodgePiscoya, Jose, MD  metoprolol succinate (TOPROL-XL) 50 MG 24 hr tablet Take 50 mg by mouth daily. Take with or immediately following a meal.    [provider]  ondansetron (ZOFRAN ODT) 4 MG disintegrating tablet Take 1 tablet (4 mg total) by mouth every 8 (eight) hours as needed for nausea or vomiting. 11/24/16   Merrily Brittleifenbark, Rollin Kotowski, MD  ramipril (ALTACE) 10 MG capsule Take 10 mg by mouth 2 (two) times daily.     [provider]  simvastatin (ZOCOR) 20 MG tablet Take 20 mg by mouth daily.    [provider]    Allergies Patient has no known allergies.  Family History  Problem Relation Age of Onset  . Hypertension Sister     Social History Social History  Substance Use Topics  . Smoking status: Former Smoker    Quit date: 03/28/1964  . Smokeless tobacco: Never Used  . Alcohol use  Yes     Comment: Occasional    Review of Systems Constitutional: No fever/chills Eyes: No visual changes. ENT: No sore throat. Cardiovascular: Denies chest pain. Respiratory: Denies shortness of breath. Gastrointestinal: No abdominal pain.  Positive nausea, no vomiting.  No diarrhea.  No constipation. Genitourinary: Negative for dysuria. Musculoskeletal: Negative for back pain. Skin: Negative for rash. Neurological: Negative for headaches, focal weakness or numbness.   ____________________________________________   PHYSICAL  EXAM:  VITAL SIGNS: ED Triage Vitals  Enc Vitals Group     BP      Pulse      Resp      Temp      Temp src      SpO2      Weight      Height      Head Circumference      Peak Flow      Pain Score      Pain Loc      Pain Edu?      Excl. in GC?     Constitutional: Alert and oriented 4 pleasant cooperative speaks in full clear sentences Eyes: PERRL EOMI. Head: Atraumatic. Nose: No congestion/rhinnorhea. Mouth/Throat: No trismus Neck: No stridor.   Cardiovascular: Normal rate, regular rhythm. Grossly normal heart sounds.  Good peripheral circulation. Respiratory: Normal respiratory effort.  No retractions. Lungs CTAB and moving good air Gastrointestinal: Distended abdomen soft nontender no rebound no guarding no peritonitis JP in place with no evidence of infection or bleeding Musculoskeletal: No lower extremity edema   Neurologic:  Normal speech and language. No gross focal neurologic deficits are appreciated. Skin:  Skin is warm, dry and intact. No rash noted. Psychiatric: Mood and affect are normal. Speech and behavior are normal.    ____________________________________________   DIFFERENTIAL includes but not limited to  Small bowel obstruction, large bowel obstruction, volvulus, abscess, infection ____________________________________________   LABS (all labs ordered are listed, but only abnormal results are displayed)  Labs Reviewed  BASIC METABOLIC PANEL - Abnormal; Notable for the following:       Result Value   Glucose, Bld 137 (*)    Calcium 8.6 (*)    GFR calc non Af Amer 56 (*)    All other components within normal limits  HEPATIC FUNCTION PANEL - Abnormal; Notable for the following:    Albumin 3.0 (*)    AST 44 (*)    All other components within normal limits  LIPASE, BLOOD - Abnormal; Notable for the following:    Lipase 55 (*)    All other components within normal limits  CBC WITH DIFFERENTIAL/PLATELET - Abnormal; Notable for the following:     RBC 4.08 (*)    Hemoglobin 12.7 (*)    HCT 38.1 (*)    RDW 14.8 (*)    Monocytes Absolute 1.1 (*)    All other components within normal limits  URINALYSIS, COMPLETE (UACMP) WITH MICROSCOPIC - Abnormal; Notable for the following:    Color, Urine YELLOW (*)    APPearance CLEAR (*)    Squamous Epithelial / LPF 0-5 (*)    All other components within normal limits  TROPONIN I  LACTIC ACID, PLASMA    Improving albumin improving renal function of infection __________________________________________  EKG  ED ECG REPORT I, Merrily Brittle, the attending physician, personally viewed and interpreted this ECG.  Date: 11/24/2016 Rate: 61 Rhythm: normal sinus rhythm QRS Axis: normal Intervals: normal ST/T Wave abnormalities: normal Narrative Interpretation: Left bundle branch block with no signs  of acute ischemia abnormal EKG  ____________________________________________  RADIOLOGY  CT scan with no evidence of infection or obstruction but postsurgical changes   PROCEDURES  Procedure(s) performed: no  Procedures  Critical Care performed: no  Observation: no ____________________________________________   INITIAL IMPRESSION / ASSESSMENT AND PLAN / ED COURSE  Pertinent labs & imaging results that were available during my care of the patient were reviewed by me and considered in my medical decision making (see chart for details).  The patient arrives in no pain but with a distended abdomen nauseated after significant abdominal surgery. Labs and a CT scan are pending.    ----------------------------------------- 5:04 PM on 11/24/2016 -----------------------------------------  The patient's blood work is reassuring and his CT scan shows normal postsurgical changes and no evidence of obstruction and no evidence of infection. Unclear etiology of the patient's nausea but likely related to postoperative healing. His albumin is up to 3.0 and his renal function has come back down to  normal. I've encouraged him to keep his follow-up with Dr. Everlene Farrier this week and I will prescribe him a short course of Zofran to help him get through this acute phase of nausea. Strict return precautions given and the patient is discharged home in good condition.  ____________________________________________   FINAL CLINICAL IMPRESSION(S) / ED DIAGNOSES  Final diagnoses:  Nausea  Abdominal distention      NEW MEDICATIONS STARTED DURING THIS VISIT:  New Prescriptions   ONDANSETRON (ZOFRAN ODT) 4 MG DISINTEGRATING TABLET    Take 1 tablet (4 mg total) by mouth every 8 (eight) hours as needed for nausea or vomiting.     Note:  This document was prepared using Dragon voice recognition software and may include unintentional dictation errors.     Merrily Brittle, MD 11/24/16 1705

## 2016-11-24 NOTE — Telephone Encounter (Signed)
Patients son is calling stating his dad is having increase pain  And is unable to eat due to the pain. H patient is nauseated.  Last bowel movement was today.  Denies drainage or redness from the incision site. Denies fever, chills.  [facility] and [facility] was instructed to go to the Emergency room.

## 2016-11-24 NOTE — ED Triage Notes (Signed)
Pt in via [facility] from [facility].  Pt reports nausea and diminished appetite since yesterday.  Pt with JP drain in place upon arrival; pt unable to state why drain is in place.  Pt denies any abdominal pain, denies vomiting/diarrhea.  Pt A/Ox4, vitals WDL, NAD noted at this time.

## 2016-11-26 ENCOUNTER — Telehealth: Payer: Self-pay

## 2016-11-26 ENCOUNTER — Telehealth: Payer: Self-pay | Admitting: General Practice

## 2016-11-26 NOTE — Telephone Encounter (Signed)
Note for patients daughter is ready. Patient will pick up letter 11/27/16.

## 2016-11-26 NOTE — Telephone Encounter (Signed)
Left message for Louis Lawson at this time to call our office.

## 2016-11-26 NOTE — Telephone Encounter (Signed)
Patients daughter Horald Polleneresa Ruggiero is calling asking if she can have a note for her work letting them know about her dad's health and when they have been seen by the provider her number is [phone] please call patient's daughter and advice.

## 2016-11-27 ENCOUNTER — Encounter: Payer: Self-pay | Admitting: Surgery

## 2016-11-27 ENCOUNTER — Ambulatory Visit (INDEPENDENT_AMBULATORY_CARE_PROVIDER_SITE_OTHER): Payer: Medicare HMO | Admitting: Surgery

## 2016-11-27 VITALS — BP 108/67 | HR 52 | Temp 97.7°F | Ht 70.0 in

## 2016-11-27 DIAGNOSIS — Z09 Encounter for follow-up examination after completed treatment for conditions other than malignant neoplasm: Secondary | ICD-10-CM

## 2016-11-27 NOTE — Telephone Encounter (Signed)
Patients daughter came by and picked up letter. 11/27/16.

## 2016-11-27 NOTE — Progress Notes (Signed)
S/p open appy and [facility] repair 7/24 Has doing very well Taking Po, , + BM, no fevers Minimal drain output  PE Debilitated male in NAD Abd: wound healing secondary intention w great granulation tissue, no evidence of infection or peritonitis. Drain removed  A/p Doing well Continue daily wet/dry and routine care No complications RTC prn

## 2016-11-27 NOTE — Patient Instructions (Signed)
We have removed your drain today. We have placed a dressing over the drain site. Please continue to change daily until sealed completely.  You will need to place wet to dry gauze  In the  incisional areas until healed completely. Then place a dry dressing over the 2 areas and secure with tape.  Please see your follow up appointment listed below.

## 2016-12-31 ENCOUNTER — Encounter: Payer: Self-pay | Admitting: Surgery

## 2016-12-31 ENCOUNTER — Ambulatory Visit (INDEPENDENT_AMBULATORY_CARE_PROVIDER_SITE_OTHER): Payer: Medicare HMO | Admitting: Cardiothoracic Surgery

## 2016-12-31 VITALS — BP 153/71 | HR 66 | Temp 97.8°F | Ht 70.0 in | Wt 228.0 lb

## 2016-12-31 DIAGNOSIS — K429 Umbilical hernia without obstruction or gangrene: Secondary | ICD-10-CM

## 2016-12-31 NOTE — Patient Instructions (Addendum)
Since you are having difficulty eating meats, make sure that you keep your protein intake up with other foods to keep you strong and healing well.  See the list below for recommendations on some foods that you can substitute.  We will see you back in one month, see appointment below.  Call with any questions or concerns prior to this appointment.    Protein Content in Foods Generally, most healthy people need around 50 grams of protein each day. Depending on your overall health, you may need more or less protein in your diet. Talk to your health care provider or dietitian about how much protein you need. See the following list for the protein content of some common foods.  High-protein foods- THESE ARE GOOD FOODS FOR PROTEIN High-protein foods contain 4 grams (4 g) or more of protein per serving. They include:  Beef, ground sirloin (cooked) - 3 oz have 24 g of protein.  Cheese (hard) - 1 oz has 7 g of protein.  Chicken breast, boneless and skinless (cooked) - 3 oz have 13.4 g of protein.  Cottage cheese - 1/2 cup has 13.4 g of protein.  Egg - 1 egg has 6 g of protein.  Fish, filet (cooked) - 1 oz has 6-7 g of protein.  Garbanzo beans (canned or cooked) - 1/2 cup has 6-7 g of protein.  Kidney beans (canned or cooked) - 1/2 cup has 6-7 g of protein.  Lamb (cooked) - 3 oz has 24 g of protein.  Milk - 1 cup (8 oz) has 8 g of protein.  Nuts (peanuts, pistachios, almonds) - 1 oz has 6 g of protein.  Peanut butter - 1 oz has 7-8 g of protein.  Pork tenderloin (cooked) - 3 oz has 18.4 g of protein.  Pumpkin seeds - 1 oz has 8.5 g of protein.  Soybeans (roasted) - 1 oz has 8 g of protein.  Soybeans (cooked) - 1/2 cup has 11 g of protein.  Soy milk - 1 cup (8 oz) has 5-10 g of protein.  Soy or vegetable patty - 1 patty has 11 g of protein.  Sunflower seeds - 1 oz has 5.5 g of protein.  Tofu (firm) - 1/2 cup has 20 g of protein.  Tuna (canned in water) - 3 oz has 20 g of  protein.  Yogurt - 6 oz has 8 g of protein.  Low-protein foods- EAT THESE FOODS WITH SOME HIGH PROTEIN FOODS Low-protein foods contain 3 grams (3 g) or less of protein per serving. They include:  Beets (raw or cooked) - 1/2 cup has 1.5 g of protein.  Bran cereal - 1/2 cup has 2-3 g of protein.  Bread - 1 slice has 2.5 g of protein.  Broccoli (raw or cooked) - 1/2 cup has 2 g of protein.  Collard greens (raw or cooked) - 1/2 cup has 2 g of protein.  Corn (fresh or cooked) - 1/2 cup has 2 g of protein.  Cream cheese - 1 oz has 2 g of protein.  Creamer (half-and-half) - 1 oz has 1 g of protein.  Flour tortilla - 1 tortilla has 2.5 g of protein  Frozen yogurt - 1/2 cup has 3 g of protein.  Fruit or vegetable juice - 1/2 cup has 1 g of protein.  Green beans (raw or cooked) - 1/2 cup has 1 g of protein.  Green peas (canned) - 1/2 cup has 3.5 g of protein.  Muffins - 1 small muffin (  2 oz) has 3 g of protein.  Oatmeal (cooked) - 1/2 cup has 3 g of protein.  Potato (baked with skin) - 1 medium potato has 3 g of protein.  Rice (cooked) - 1/2 cup has 2.5-3.5 g of protein.  Sour cream - 1/2 cup has 2.5 g of protein.  Spinach (cooked) - 1/2 cup has 3 g of protein.  Squash (cooked) - 1/2 cup has 1.5 g of protein.  Actual amounts of protein may be different depending on processing. Talk with your health care provider or dietitian about what foods are recommended for you. This information is not intended to replace advice given to you by your health care provider. Make sure you discuss any questions you have with your health care provider. Document Released: 06/30/2015 Document Revised: 12/10/2015 Document Reviewed: 12/10/2015 Elsevier Interactive Patient Education  [employer].

## 2016-12-31 NOTE — Progress Notes (Signed)
  Patient ID: Chong Wojdyla, male   DOB: 06-06-1922, 83 y.o.   MRN: [id number]  HISTORY: Patient is doing very well.  Lost 12 pounds postop but is eating and moving his bowels without problems.  He did have a small stitch abscess that was removed today at the midportion of his wound.  No other issues.    Vitals:   12/31/16 1026  BP: (!) 153/71  Pulse: 66  Temp: 97.8 F (36.6 C)     EXAM:   Abd:  Abdomen is soft, non distended and non tender. No masses are palpable.  There is no rebound and no guarding. Normal bowel sounds.  Wounds are clean and dry.  Palpable "knot" under midline wound likely PDS.  No hernia with deep cough.  No erythema or drainage. Neurological: Alert and oriented to person, place, and time. Coordination normal.  Skin: Skin is warm and dry. No rash noted. No diaphoretic. No erythema. No pallor.  Psychiatric: Normal mood and affect. Normal behavior. Judgment and thought content normal.    ASSESSMENT: 2 months status post exploratory lap.  Doing very well   PLAN:   May shower.  Increase his activities.  Encourage high protein diet.  Will see in one month.    Hulda Marin, MD

## 2017-02-04 ENCOUNTER — Ambulatory Visit (INDEPENDENT_AMBULATORY_CARE_PROVIDER_SITE_OTHER): Payer: Medicare HMO | Admitting: Surgery

## 2017-02-04 ENCOUNTER — Encounter: Payer: Self-pay | Admitting: Surgery

## 2017-02-04 VITALS — BP 164/80 | HR 73 | Temp 97.5°F | Ht 70.0 in | Wt 227.0 lb

## 2017-02-04 DIAGNOSIS — Z09 Encounter for follow-up examination after completed treatment for conditions other than malignant neoplasm: Secondary | ICD-10-CM | POA: Diagnosis not present

## 2017-02-04 NOTE — Progress Notes (Signed)
S/p open appendectomy Doing well Taking po  PE NAD elderly male Abd: soft, incision healed, no infection. No peritonitis  A/P Doing well RTC prn

## 2017-02-04 NOTE — Patient Instructions (Signed)
GENERAL POST-OPERATIVE PATIENT INSTRUCTIONS   WOUND CARE INSTRUCTIONS:  Keep a dry clean dressing on the wound if there is drainage. The initial bandage may be removed after 24 hours.  Once the wound has quit draining you may leave it open to air.  If clothing rubs against the wound or causes irritation and the wound is not draining you may cover it with a dry dressing during the daytime.  Try to keep the wound dry and avoid ointments on the wound unless directed to do so.  If the wound becomes bright red and painful or starts to drain infected material that is not clear, please contact your physician immediately.  If the wound is mildly pink and has a thick firm ridge underneath it, this is normal, and is referred to as a healing ridge.  This will resolve over the next 4-6 weeks.  BATHING: You may shower if you have been informed of this by your surgeon. However, Please do not submerge in a tub, hot tub, or pool until incisions are completely sealed or have been told by your surgeon that you may do so.  DIET:  You may eat any foods that you can tolerate.  It is a good idea to eat a high fiber diet and take in plenty of fluids to prevent constipation.  If you do become constipated you may want to take a mild laxative or take ducolax tablets on a daily basis until your bowel habits are regular.  Constipation can be very uncomfortable, along with straining, after recent surgery.  ACTIVITY:  You are encouraged to cough and deep breath or use your incentive spirometer if you were given one, every 15-30 minutes when awake.  This will help prevent respiratory complications and low grade fevers post-operatively if you had a general anesthetic.  You may want to hug a pillow when coughing and sneezing to add additional support to the surgical area, if you had abdominal or chest surgery, which will decrease pain during these times.  You are encouraged to walk and engage in light activity for the next two weeks.  You  should not lift more than 20 pounds, until 03/07/2017 as it could put you at increased risk for complications.  Twenty pounds is roughly equivalent to a plastic bag of groceries. At that time- Listen to your body when lifting, if you have pain when lifting, stop and then try again in a few days. Soreness after doing exercises or activities of daily living is normal as you get back in to your normal routine.  MEDICATIONS:  Try to take narcotic medications and anti-inflammatory medications, such as tylenol, ibuprofen, naprosyn, etc., with food.  This will minimize stomach upset from the medication.  Should you develop nausea and vomiting from the pain medication, or develop a rash, please discontinue the medication and contact your physician.  You should not drive, make important decisions, or operate machinery when taking narcotic pain medication.  SUNBLOCK Use sun block to incision area over the next year if this area will be exposed to sun. This helps decrease scarring and will allow you avoid a permanent darkened area over your incision.  QUESTIONS:  Please feel free to call our office if you have any questions, and we will be glad to assist you. [phone]    

## 2017-02-12 ENCOUNTER — Encounter: Payer: Self-pay | Admitting: Emergency Medicine

## 2017-02-12 ENCOUNTER — Emergency Department: Payer: Medicare HMO

## 2017-02-12 ENCOUNTER — Observation Stay
Admission: EM | Admit: 2017-02-12 | Discharge: 2017-02-14 | Disposition: A | Discharge status: Home / Self Care | Payer: Medicare HMO | Attending: Internal Medicine | Admitting: Internal Medicine

## 2017-02-12 DIAGNOSIS — E860 Dehydration: Secondary | ICD-10-CM | POA: Insufficient documentation

## 2017-02-12 DIAGNOSIS — J129 Viral pneumonia, unspecified: Secondary | ICD-10-CM | POA: Diagnosis present

## 2017-02-12 DIAGNOSIS — R319 Hematuria, unspecified: Secondary | ICD-10-CM | POA: Diagnosis not present

## 2017-02-12 DIAGNOSIS — Z87891 Personal history of nicotine dependence: Secondary | ICD-10-CM | POA: Insufficient documentation

## 2017-02-12 DIAGNOSIS — I2510 Atherosclerotic heart disease of native coronary artery without angina pectoris: Secondary | ICD-10-CM | POA: Insufficient documentation

## 2017-02-12 DIAGNOSIS — H1030 Unspecified acute conjunctivitis, unspecified eye: Secondary | ICD-10-CM | POA: Diagnosis not present

## 2017-02-12 DIAGNOSIS — M109 Gout, unspecified: Secondary | ICD-10-CM | POA: Diagnosis not present

## 2017-02-12 DIAGNOSIS — J181 Lobar pneumonia, unspecified organism: Secondary | ICD-10-CM

## 2017-02-12 DIAGNOSIS — E785 Hyperlipidemia, unspecified: Secondary | ICD-10-CM | POA: Diagnosis not present

## 2017-02-12 DIAGNOSIS — Z79899 Other long term (current) drug therapy: Secondary | ICD-10-CM | POA: Diagnosis not present

## 2017-02-12 DIAGNOSIS — M6281 Muscle weakness (generalized): Secondary | ICD-10-CM | POA: Insufficient documentation

## 2017-02-12 DIAGNOSIS — I10 Essential (primary) hypertension: Secondary | ICD-10-CM | POA: Diagnosis not present

## 2017-02-12 DIAGNOSIS — I878 Other specified disorders of veins: Secondary | ICD-10-CM | POA: Insufficient documentation

## 2017-02-12 DIAGNOSIS — J189 Pneumonia, unspecified organism: Secondary | ICD-10-CM | POA: Diagnosis present

## 2017-02-12 LAB — URINALYSIS, COMPLETE (UACMP) WITH MICROSCOPIC
BACTERIA UA: NONE SEEN
Bilirubin Urine: NEGATIVE
GLUCOSE, UA: NEGATIVE mg/dL
Ketones, ur: NEGATIVE mg/dL
LEUKOCYTES UA: NEGATIVE
NITRITE: NEGATIVE
Protein, ur: 100 mg/dL — AB
SPECIFIC GRAVITY, URINE: 1.01 (ref 1.005–1.030)
pH: 7 (ref 5.0–8.0)

## 2017-02-12 LAB — BASIC METABOLIC PANEL
Anion gap: 10 (ref 5–15)
BUN: 19 mg/dL (ref 6–20)
CHLORIDE: 98 mmol/L — AB (ref 101–111)
CO2: 27 mmol/L (ref 22–32)
Calcium: 8.7 mg/dL — ABNORMAL LOW (ref 8.9–10.3)
Creatinine, Ser: 1.09 mg/dL (ref 0.61–1.24)
GFR calc Af Amer: >60 mL/min (ref >60)
GFR calc non Af Amer: 57 mL/min — ABNORMAL LOW (ref >60)
GLUCOSE: 133 mg/dL — AB (ref 65–99)
POTASSIUM: 4.3 mmol/L (ref 3.5–5.1)
Sodium: 135 mmol/L (ref 135–145)

## 2017-02-12 LAB — CBC WITH DIFFERENTIAL/PLATELET
BASOS PCT: 1 %
Basophils Absolute: 0.1 10*3/uL (ref 0–0.1)
EOS ABS: 0.2 10*3/uL (ref 0–0.7)
EOS PCT: 2 %
HCT: 34.5 % — ABNORMAL LOW (ref 40.0–52.0)
HEMOGLOBIN: 11.4 g/dL — AB (ref 13.0–18.0)
Lymphocytes Relative: 24 %
Lymphs Abs: 2.3 10*3/uL (ref 1.0–3.6)
MCH: 30.1 pg (ref 26.0–34.0)
MCHC: 33 g/dL (ref 32.0–36.0)
MCV: 91.3 fL (ref 80.0–100.0)
MONO ABS: 1.4 10*3/uL — AB (ref 0.2–1.0)
MONOS PCT: 15 %
Neutro Abs: 5.4 10*3/uL (ref 1.4–6.5)
Neutrophils Relative %: 58 %
PLATELETS: 269 10*3/uL (ref 150–440)
RBC: 3.78 MIL/uL — AB (ref 4.40–5.90)
RDW: 15.2 % — ABNORMAL HIGH (ref 11.5–14.5)
WBC: 9.4 10*3/uL (ref 3.8–10.6)

## 2017-02-12 LAB — HEPATIC FUNCTION PANEL
ALK PHOS: 64 U/L (ref 38–126)
ALT: 14 U/L — AB (ref 17–63)
AST: 24 U/L (ref 15–41)
Albumin: 3.2 g/dL — ABNORMAL LOW (ref 3.5–5.0)
BILIRUBIN DIRECT: 0.3 mg/dL (ref 0.1–0.5)
BILIRUBIN INDIRECT: 0.5 mg/dL (ref 0.3–0.9)
Total Bilirubin: 0.8 mg/dL (ref 0.3–1.2)
Total Protein: 8.2 g/dL — ABNORMAL HIGH (ref 6.5–8.1)

## 2017-02-12 LAB — TROPONIN I: Troponin I: <0.03 ng/mL (ref <0.03)

## 2017-02-12 NOTE — ED Notes (Signed)
Patient transported to X-ray 

## 2017-02-12 NOTE — ED Triage Notes (Signed)
Pt to ED via EMS from home c/o productive green/yellow cough x1 month.  Denies pain or SOB.  Also states while going to the bathroom tonight noticed passing blood clots in urine.  Denies urinary pain or frequency.  Upon arrival noted that patient has large amount of discharge from bilateral eyes, denies pain or itching or unknown time of how long.

## 2017-02-12 NOTE — ED Provider Notes (Addendum)
[facility] Emergency Department Provider Note  ____________________________________________   First MD Initiated Contact with Patient 02/12/17 2256     (approximate)  I have reviewed the triage vital signs and the nursing notes.   HISTORY  Chief Complaint Cough and Hematuria   HPI Louis Lawson is a 83 y.o. male who self presents to the emergency department with painless hematuria for the past 2 hours or so.  He has no flank pain.  No abdominal pain nausea or vomiting.  He also reports roughly 1 month of increasingly productive cough.  He has moderate severity aching chest pain worse with coughing improved with not coughing.  He has not received any antibiotics.  About 4 months ago he had a perforated appendicitis and a one-week hospital stay although has healed nicely thereafter.  He was not able to get his flu shot this year as he was sick when he tried to get it.  Past Medical History:  Diagnosis Date  . Coronary artery disease   . Hypertension   . Irregular heart beat     Patient Active Problem List   Diagnosis Date Noted  . Perforated appendicitis 11/04/2016  . Respiratory failure (HCC) 11/04/2016  . Small bowel perforation (HCC)   . Umbilical hernia without obstruction and without gangrene     Past Surgical History:  Procedure Laterality Date  . APPENDECTOMY N/A 11/04/2016   Procedure: APPENDECTOMY;  Surgeon: Leafy RoPabon, Diego F, MD;  Location: [facility] ORS;  Service: General;  Laterality: N/A;  . LAPAROTOMY N/A 11/04/2016   Procedure: EXPLORATORY LAPAROTOMY;  Surgeon: Leafy RoPabon, Diego F, MD;  Location: [facility] ORS;  Service: General;  Laterality: N/A;  . UMBILICAL HERNIA REPAIR N/A 11/04/2016   Procedure: HERNIA REPAIR UMBILICAL ADULT;  Surgeon: Leafy RoPabon, Diego F, MD;  Location: [facility] ORS;  Service: General;  Laterality: N/A;  . VASCULAR SURGERY Right    Lower Extremity- secondary to DVT    Prior to Admission medications   Medication Sig Start Date End Date  Taking? Authorizing Provider  acetaminophen (TYLENOL) 325 MG tablet Take 2 tablets (650 mg total) by mouth every 4 (four) hours as needed for mild pain (temp > 101.5). 11/11/16  Yes Piscoya, Elita QuickJose, MD  allopurinol (ZYLOPRIM) 300 MG tablet Take 1 tablet by mouth daily. 02/27/16  Yes [provider]  amLODipine (NORVASC) 10 MG tablet Take 1 tablet by mouth daily. 02/27/16  Yes [provider]  cholecalciferol (VITAMIN D) 1000 units tablet Take 1,000 Units by mouth daily.    Yes [provider]  furosemide (LASIX) 40 MG tablet Take 40 mg by mouth daily.    Yes [provider]  ondansetron (ZOFRAN ODT) 4 MG disintegrating tablet Take 1 tablet (4 mg total) by mouth every 8 (eight) hours as needed for nausea or vomiting. 11/24/16  Yes Merrily Brittleifenbark, Bari Handshoe, MD  ramipril (ALTACE) 10 MG capsule Take 10 mg by mouth 2 (two) times daily.    Yes [provider]  simvastatin (ZOCOR) 20 MG tablet Take 20 mg by mouth daily.   Yes [provider]    Allergies Patient has no known allergies.  Family History  Problem Relation Age of Onset  . Hypertension Sister     Social History Social History  Substance Use Topics  . Smoking status: Former Smoker    Quit date: 03/28/1964  . Smokeless tobacco: Never Used  . Alcohol use Yes     Comment: Occasional    Review of Systems Constitutional: No fever/chills Eyes:  No visual changes. ENT: No sore throat. Cardiovascular: Positive for chest pain. Respiratory: Positive for shortness of breath. Gastrointestinal: No abdominal pain.  No nausea, no vomiting.  No diarrhea.  No constipation. Genitourinary: Negative for dysuria. Musculoskeletal: Negative for back pain. Skin: Negative for rash. Neurological: Negative for headaches, focal weakness or numbness.   ____________________________________________   PHYSICAL EXAM:  VITAL SIGNS: ED Triage Vitals  Enc Vitals Group     BP 02/12/17 2247 (!) 168/61      Pulse Rate 02/12/17 2247 70     Resp 02/12/17 2247 20     Temp 02/12/17 2247 98 F (36.7 C)     Temp Source 02/12/17 2247 Oral     SpO2 02/12/17 2247 94 %     Weight 02/12/17 2250 225 lb (102.1 kg)     Height 02/12/17 2250 5' 9.5" (1.765 m)     Head Circumference --      Peak Flow --      Pain Score --      Pain Loc --      Pain Edu? --      Excl. in GC? --     Constitutional: Alert and oriented x4 appears somewhat short of breath with elevated respiratory rate although no diaphoresis Eyes: PERRL EOMI. purulent appearing conjunctival discharge bilaterally Head: Atraumatic. Nose: No congestion/rhinnorhea. Mouth/Throat: No trismus Neck: No stridor.   Cardiovascular: Normal rate, regular rhythm. Grossly normal heart sounds.  Good peripheral circulation. Respiratory: Increased respiratory effort with rales at left base although moving good air Gastrointestinal: Soft nondistended nontender no rebound no guarding no peritonitis no costovertebral tenderness midline surgical scar appears well-healed Musculoskeletal: No lower extremity edema   Neurologic:  Normal speech and language. No gross focal neurologic deficits are appreciated. Skin:  Skin is warm, dry and intact. No rash noted. Psychiatric: Mood and affect are normal. Speech and behavior are normal.    ____________________________________________   DIFFERENTIAL includes but not limited to  Pneumonia, pneumothorax, pulmonary embolism, AAA, renal stone, urinary tract infection, pyelonephritis ____________________________________________   LABS (all labs ordered are listed, but only abnormal results are displayed)  Labs Reviewed  URINALYSIS, COMPLETE (UACMP) WITH MICROSCOPIC - Abnormal; Notable for the following:       Result Value   Color, Urine YELLOW (*)    APPearance CLEAR (*)    Hgb urine dipstick MODERATE (*)    Protein, ur 100 (*)    Squamous Epithelial / LPF 0-5 (*)    All other components within normal limits   BASIC METABOLIC PANEL - Abnormal; Notable for the following:    Chloride 98 (*)    Glucose, Bld 133 (*)    Calcium 8.7 (*)    GFR calc non Af Amer 57 (*)    All other components within normal limits  HEPATIC FUNCTION PANEL - Abnormal; Notable for the following:    Total Protein 8.2 (*)    Albumin 3.2 (*)    ALT 14 (*)    All other components within normal limits  CBC WITH DIFFERENTIAL/PLATELET - Abnormal; Notable for the following:    RBC 3.78 (*)    Hemoglobin 11.4 (*)    HCT 34.5 (*)    RDW 15.2 (*)    Monocytes Absolute 1.4 (*)    All other components within normal limits  CULTURE, BLOOD (ROUTINE X 2)  CULTURE, BLOOD (ROUTINE X 2)  TROPONIN I  INFLUENZA PANEL BY PCR (TYPE A & B)  LACTIC ACID, PLASMA  LACTIC ACID,  PLASMA  PROCALCITONIN    Blood work reviewed and interpreted by me shows slightly elevated glucose but no evidence of diabetic ketoacidosis.  He has hematuria with no evidence of infection __________________________________________  EKG  ED ECG REPORT I, Merrily Brittle, the attending physician, personally viewed and interpreted this ECG.  Date: 02/13/2017 EKG Time:  Rate: 71 Rhythm: normal sinus rhythm QRS Axis: normal Intervals: First-degree AV block ST/T Wave abnormalities: normal Narrative Interpretation: Normal sinus rhythm 71 first-degree AV block no signs of acute ischemia left bundle branch block abnormal EKG  ____________________________________________  RADIOLOGY  CT stone reviewed by me shows left lower lobe pneumonia ____________________________________________   PROCEDURES  Procedure(s) performed: no  Procedures  Critical Care performed: no  Observation: no ____________________________________________   INITIAL IMPRESSION / ASSESSMENT AND PLAN / ED COURSE  Pertinent labs & imaging results that were available during my care of the patient were reviewed by me and considered in my medical decision making (see chart for  details).      ----------------------------------------- 12:41 AM on 02/13/2017 -----------------------------------------  The patient remains relatively hypoxic in the low 90s.  He has a lobar pneumonia in the left lower lobe and while he was admitted for a perforated appendicitis it was more than 90 days ago.  At this point given his advanced age and oxygen requirements he requires intravenous antibiotics, IV fluids, and inpatient admission.  His CT scan is negative for etiology of his hematuria and he will likely require a urology evaluation as an outpatient.  I discussed the case with the hospitalist Dr. Anne Hahn who has graciously agreed to admit the patient to his service.  I then discussed with the patient and his family who agreed with inpatient admission.  ____________________________________________   FINAL CLINICAL IMPRESSION(S) / ED DIAGNOSES  Final diagnoses:  Community acquired pneumonia of left lower lobe of lung (HCC)      NEW MEDICATIONS STARTED DURING THIS VISIT:  New Prescriptions   No medications on file     Note:  This document was prepared using Dragon voice recognition software and may include unintentional dictation errors.     Merrily Brittle, MD 02/13/17 9629    Merrily Brittle, MD 02/13/17 5284    Merrily Brittle, MD 02/13/17 0230

## 2017-02-13 ENCOUNTER — Encounter: Payer: Self-pay | Admitting: *Deleted

## 2017-02-13 DIAGNOSIS — J189 Pneumonia, unspecified organism: Secondary | ICD-10-CM | POA: Diagnosis present

## 2017-02-13 LAB — PROCALCITONIN

## 2017-02-13 LAB — INFLUENZA PANEL BY PCR (TYPE A & B)
INFLAPCR: NEGATIVE
INFLBPCR: NEGATIVE

## 2017-02-13 LAB — RESPIRATORY PANEL BY PCR
Adenovirus: NOT DETECTED
BORDETELLA PERTUSSIS-RVPCR: NOT DETECTED
CHLAMYDOPHILA PNEUMONIAE-RVPPCR: NOT DETECTED
CORONAVIRUS NL63-RVPPCR: NOT DETECTED
Coronavirus 229E: NOT DETECTED
Coronavirus HKU1: NOT DETECTED
Coronavirus OC43: NOT DETECTED
INFLUENZA A-RVPPCR: NOT DETECTED
INFLUENZA B-RVPPCR: NOT DETECTED
MYCOPLASMA PNEUMONIAE-RVPPCR: NOT DETECTED
Metapneumovirus: NOT DETECTED
PARAINFLUENZA VIRUS 3-RVPPCR: NOT DETECTED
PARAINFLUENZA VIRUS 4-RVPPCR: NOT DETECTED
Parainfluenza Virus 1: NOT DETECTED
Parainfluenza Virus 2: NOT DETECTED
RESPIRATORY SYNCYTIAL VIRUS-RVPPCR: NOT DETECTED
Rhinovirus / Enterovirus: NOT DETECTED

## 2017-02-13 LAB — TSH: TSH: 1.88 u[IU]/mL (ref 0.350–4.500)

## 2017-02-13 LAB — LACTIC ACID, PLASMA
Lactic Acid, Venous: 1.2 mmol/L (ref 0.5–1.9)
Lactic Acid, Venous: 1.2 mmol/L (ref 0.5–1.9)

## 2017-02-13 MED ORDER — SODIUM CHLORIDE 0.9 % IV SOLN
INTRAVENOUS | Status: DC
  Administered 2017-02-13: 07:00:00 via INTRAVENOUS

## 2017-02-13 MED ORDER — ONDANSETRON HCL 4 MG/2ML IJ SOLN: 4.0000 mg | Freq: Four times a day (QID) | INTRAMUSCULAR | Status: DC | PRN

## 2017-02-13 MED ORDER — DOCUSATE SODIUM 100 MG PO CAPS
100.0000 mg | ORAL_CAPSULE | Freq: Two times a day (BID) | ORAL | Status: DC
  Administered 2017-02-13 – 2017-02-14 (×3): 100 mg via ORAL
  Filled 2017-02-13 (×3): qty 1

## 2017-02-13 MED ORDER — FUROSEMIDE 40 MG PO TABS
40.0000 mg | ORAL_TABLET | Freq: Every day | ORAL | Status: DC
  Administered 2017-02-13 – 2017-02-14 (×2): 40 mg via ORAL
  Filled 2017-02-13 (×2): qty 1

## 2017-02-13 MED ORDER — ENOXAPARIN SODIUM 40 MG/0.4ML ~~LOC~~ SOLN
40.0000 mg | SUBCUTANEOUS | Status: DC
  Administered 2017-02-13: 40 mg via SUBCUTANEOUS
  Filled 2017-02-13: qty 0.4

## 2017-02-13 MED ORDER — AMLODIPINE BESYLATE 5 MG PO TABS
2.5000 mg | ORAL_TABLET | Freq: Every day | ORAL | Status: DC
  Administered 2017-02-13 – 2017-02-14 (×2): 2.5 mg via ORAL
  Filled 2017-02-13 (×2): qty 1

## 2017-02-13 MED ORDER — DEXTROSE 5 % IV SOLN
1.0000 g | INTRAVENOUS | Status: DC
  Administered 2017-02-13: 1 g via INTRAVENOUS

## 2017-02-13 MED ORDER — POLYMYXIN B-TRIMETHOPRIM 10000-0.1 UNIT/ML-% OP SOLN
1.0000 [drp] | Freq: Four times a day (QID) | OPHTHALMIC | Status: DC
  Administered 2017-02-13 – 2017-02-14 (×6): 1 [drp] via OPHTHALMIC
  Filled 2017-02-13: qty 10

## 2017-02-13 MED ORDER — AMLODIPINE BESYLATE 10 MG PO TABS: 10.0000 mg | ORAL_TABLET | Freq: Every day | ORAL | Status: DC

## 2017-02-13 MED ORDER — ACETAMINOPHEN 650 MG RE SUPP
650.0000 mg | Freq: Four times a day (QID) | RECTAL | Status: DC | PRN
Start: 2017-02-13 — End: 2017-02-14

## 2017-02-13 MED ORDER — AMOXICILLIN 500 MG PO CAPS
500.0000 mg | ORAL_CAPSULE | Freq: Three times a day (TID) | ORAL | Status: DC
  Filled 2017-02-13 (×2): qty 1

## 2017-02-13 MED ORDER — AZITHROMYCIN 500 MG PO TABS: 250.0000 mg | ORAL_TABLET | Freq: Every day | ORAL | Status: DC

## 2017-02-13 MED ORDER — BUDESONIDE 0.5 MG/2ML IN SUSP
0.5000 mg | Freq: Two times a day (BID) | RESPIRATORY_TRACT | Status: DC
  Administered 2017-02-13 – 2017-02-14 (×2): 0.5 mg via RESPIRATORY_TRACT
  Filled 2017-02-13 (×2): qty 2

## 2017-02-13 MED ORDER — DEXTROSE 5 % IV SOLN
500.0000 mg | Freq: Once | INTRAVENOUS | Status: AC
  Administered 2017-02-13: 500 mg via INTRAVENOUS
  Filled 2017-02-13: qty 500

## 2017-02-13 MED ORDER — CEFTRIAXONE SODIUM IN DEXTROSE 20 MG/ML IV SOLN
1.0000 g | Freq: Once | INTRAVENOUS | Status: AC
  Administered 2017-02-13: 1 g via INTRAVENOUS
  Filled 2017-02-13 (×3): qty 50

## 2017-02-13 MED ORDER — SODIUM CHLORIDE 0.9 % IV BOLUS (SEPSIS)
1000.0000 mL | Freq: Once | INTRAVENOUS | Status: AC
  Administered 2017-02-13: 1000 mL via INTRAVENOUS

## 2017-02-13 MED ORDER — ALLOPURINOL 300 MG PO TABS
300.0000 mg | ORAL_TABLET | Freq: Every day | ORAL | Status: DC
  Administered 2017-02-13 – 2017-02-14 (×2): 300 mg via ORAL
  Filled 2017-02-13 (×2): qty 1

## 2017-02-13 MED ORDER — IPRATROPIUM-ALBUTEROL 0.5-2.5 (3) MG/3ML IN SOLN
3.0000 mL | Freq: Four times a day (QID) | RESPIRATORY_TRACT | Status: DC
  Administered 2017-02-13 – 2017-02-14 (×4): 3 mL via RESPIRATORY_TRACT
  Filled 2017-02-13 (×4): qty 3

## 2017-02-13 MED ORDER — VITAMIN D 1000 UNITS PO TABS
1000.0000 [IU] | ORAL_TABLET | Freq: Every day | ORAL | Status: DC
  Administered 2017-02-13 – 2017-02-14 (×2): 1000 [IU] via ORAL
  Filled 2017-02-13 (×2): qty 1

## 2017-02-13 MED ORDER — DEXTROSE 5 % IV SOLN: 500.0000 mg | INTRAVENOUS | Status: DC

## 2017-02-13 MED ORDER — ACETAMINOPHEN 325 MG PO TABS: 650.0000 mg | ORAL_TABLET | Freq: Four times a day (QID) | ORAL | Status: DC | PRN

## 2017-02-13 MED ORDER — RAMIPRIL 10 MG PO CAPS
10.0000 mg | ORAL_CAPSULE | Freq: Two times a day (BID) | ORAL | Status: DC
  Administered 2017-02-13 – 2017-02-14 (×3): 10 mg via ORAL
  Filled 2017-02-13 (×4): qty 1

## 2017-02-13 MED ORDER — SIMVASTATIN 20 MG PO TABS: 20.0000 mg | ORAL_TABLET | Freq: Every day | ORAL | Status: DC

## 2017-02-13 MED ORDER — ONDANSETRON HCL 4 MG PO TABS: 4.0000 mg | ORAL_TABLET | Freq: Four times a day (QID) | ORAL | Status: DC | PRN

## 2017-02-13 NOTE — Evaluation (Signed)
Occupational Therapy Evaluation Patient Details Name: Louis Lawson MRN: [id number] DOB: 03-03-1923 Today's Date: 02/13/2017    History of Present Illness Pt. is a 83 y.o. male who was admitted to [facility] with Community Acquired Pneumonia of the left lower lobe. Pt. PMHx includes: CAD, HTN, Irregular Heartbeat. Past surgical History includes: appendectomy, laparatomy, umbilical hernia repair, and vascular surgery.    Clinical Impression   Pt. is a 83 y.o. male who was admitted to [facility] with Community Acquired Pneumonia of the Left Lower Lobe. Pt. presents with weakness, decreased activity tolerance, limited functional mobility which limit his ability to complete ADL tasks. Pt. Education was provided about energy conservation techniques for ADL tasks. Pt. could benefit from skilled OT services for ADL training, A/E training, UE there. Ex., and pt. education about energy conservation, work simplification techniques, home modification, and DME. Pt. Reports that he resides at home with his wife, and has supportive family. Pt. Would benefit from follow-up OT services upon discharge.    Follow Up Recommendations  SNF    Equipment Recommendations       Recommendations for Other Services       Precautions / Restrictions        Mobility Bed Mobility    Supine to sit to EOB with ModA   MaxA to reposition to the Head of the bed.                                                                               ADL either performed or assessed with clinical judgement   ADL Overall ADL's : Needs assistance/impaired Eating/Feeding: Set up;Bed level   Grooming: Set up;Minimal assistance   Upper Body Bathing: Minimal assistance   Lower Body Bathing: Moderate assistance   Upper Body Dressing : Minimal assistance   Lower Body Dressing: Maximal assistance                 General ADL Comments: Pt. education was provided about energy conservation techniques  for ADLs.     Vision Patient Visual Report: No change from baseline       Perception     Praxis      Pertinent Vitals/Pain Pain Assessment: No/denies pain     Hand Dominance Right   Extremity/Trunk Assessment Upper Extremity Assessment Upper Extremity Assessment: Generalized weakness           Communication Communication Communication: No difficulties   Cognition Arousal/Alertness: Awake/alert Behavior During Therapy: WFL for tasks assessed/performed;Flat affect Overall Cognitive Status: No family/caregiver present to determine baseline cognitive functioning                                     General Comments       Exercises     Shoulder Instructions      Home Living Family/patient expects to be discharged to:: Private residence Living Arrangements: Spouse/significant other Available Help at Discharge: Family Type of Home: House Home Access: Stairs to enter Secretary/administrator of Steps: 3   Home Layout: One level     Bathroom Shower/Tub: Tub/shower unit;Curtain         Home Equipment:  Grab bars - tub/shower;Walker - 2 wheels          Prior Functioning/Environment Level of Independence: Independent with assistive device(s)        Comments: Pt. reports he was independent with ADLs, and IADLs. Pt. does not drive.        OT Problem List: Decreased strength;Decreased range of motion;Decreased activity tolerance;Impaired UE functional use;Decreased safety awareness;Decreased knowledge of use of DME or AE      OT Treatment/Interventions: Self-care/ADL training;Therapeutic exercise;Patient/family education;Energy conservation;Therapeutic activities    OT Goals(Current goals can be found in the care plan section) Acute Rehab OT Goals Patient Stated Goal: To return home OT Goal Formulation: With patient Potential to Achieve Goals: Good  OT Frequency: Min 2X/week   Barriers to D/C:            Co-evaluation               AM-PAC PT "6 Clicks" Daily Activity     Outcome Measure Help from another person eating meals?: None Help from another person taking care of personal grooming?: A Little Help from another person toileting, which includes using toliet, bedpan, or urinal?: A Lot Help from another person bathing (including washing, rinsing, drying)?: A Lot Help from another person to put on and taking off regular upper body clothing?: A Little Help from another person to put on and taking off regular lower body clothing?: A Lot 6 Click Score: 16   End of Session    Activity Tolerance: Patient limited by fatigue Patient left: in bed;with bed alarm set;with call bell/phone within reach  OT Visit Diagnosis: Muscle weakness (generalized) (M62.81)                Time: [id number] OT Time Calculation (min): 28 min Charges:  OT General Charges $OT Visit: 1 Visit OT Evaluation $OT Eval Moderate Complexity: 1 Mod G-Codes: OT G-codes **NOT FOR INPATIENT CLASS** Functional Limitation: Self care Self Care Current Status (V4098(G8987): At least 40 percent but less than 60 percent impaired, limited or restricted Self Care Goal Status (J1914(G8988): At least 1 percent but less than 20 percent impaired, limited or restricted   [name], MS, OTR/L  [name], MS, OTR/L 02/13/2017, 1:01 PM

## 2017-02-13 NOTE — Progress Notes (Signed)
Patient ID: Louis Lawson, male   DOB: Jun 30, 1922, 83 y.o.   MRN: [id number]  [employer] PROGRESS NOTE  Louis Lawson WUJ:[id number] DOB: 04-22-1922 DOA: 02/12/2017 PCP: [facility]  HPI/Subjective: Patient having a cough.  States he been coughing up some yellow phlegm for a couple days.  He came in for hematuria and they did a CT scan which showed pneumonia of the left lung.    Objective: Vitals:   02/13/17 0544 02/13/17 1010  BP: (!) 186/73 (!) 162/56  Pulse: 76   Resp: 19   Temp: (!) 97.5 F (36.4 C)   SpO2: 93%     Filed Weights   02/12/17 2250  Weight: 102.1 kg (225 lb)    ROS: Review of Systems  Constitutional: Positive for fever. Negative for chills.  Eyes: Negative for blurred vision.  Respiratory: Positive for shortness of breath and wheezing. Negative for cough.   Cardiovascular: Negative for chest pain.  Gastrointestinal: Negative for abdominal pain, constipation, diarrhea, nausea and vomiting.  Genitourinary: Negative for dysuria.  Musculoskeletal: Negative for joint pain.  Neurological: Negative for dizziness and headaches.   Exam: Physical Exam  Constitutional: He is oriented to person, place, and time.  HENT:  Nose: No mucosal edema.  Mouth/Throat: No oropharyngeal exudate or posterior oropharyngeal edema.  Eyes: Pupils are equal, round, and reactive to light. Conjunctivae, EOM and lids are normal.  Neck: No JVD present. Carotid bruit is not present. No edema present. No thyroid mass and no thyromegaly present.  Cardiovascular: S1 normal and S2 normal.  Exam reveals no gallop.   No murmur heard. Pulses:      Dorsalis pedis pulses are 2+ on the right side, and 2+ on the left side.  Respiratory: No respiratory distress. He has decreased breath sounds in the right lower field and the left lower field. He has no wheezes. He has rhonchi in the right middle field, the right lower field, the left middle field and the left lower field. He has no  rales.  GI: Soft. Bowel sounds are normal. There is no tenderness.  Musculoskeletal:       Right ankle: He exhibits swelling.       Left ankle: He exhibits swelling.  Lymphadenopathy:    He has no cervical adenopathy.  Neurological: He is alert and oriented to person, place, and time. No cranial nerve deficit.  Skin: Skin is warm. Nails show no clubbing.  Chronic lower extremity discoloration  Psychiatric: He has a normal mood and affect.      Data Reviewed: Basic Metabolic Panel:  Recent Labs Lab 02/12/17 2305  NA 135  K 4.3  CL 98*  CO2 27  GLUCOSE 133*  BUN 19  CREATININE 1.09  CALCIUM 8.7*   Liver Function Tests:  Recent Labs Lab 02/12/17 2305  AST 24  ALT 14*  ALKPHOS 64  BILITOT 0.8  PROT 8.2*  ALBUMIN 3.2*   CBC:  Recent Labs Lab 02/12/17 2305  WBC 9.4  NEUTROABS 5.4  HGB 11.4*  HCT 34.5*  MCV 91.3  PLT 269   Cardiac Enzymes:  Recent Labs Lab 02/12/17 2305  TROPONINI <0.03     Recent Results (from the past 240 hour(s))  Blood Culture (routine x 2)     Status: None (Preliminary result)   Collection Time: 02/13/17 12:40 AM  Result Value Ref Range Status   Specimen Description BLOOD BLOOD LEFT HAND  Final   Special Requests   Final    BOTTLES DRAWN  AEROBIC AND ANAEROBIC Blood Culture adequate volume   Culture NO GROWTH < 12 HOURS  Final   Report Status PENDING  Incomplete  Blood Culture (routine x 2)     Status: None (Preliminary result)   Collection Time: 02/13/17 12:40 AM  Result Value Ref Range Status   Specimen Description BLOOD BLOOD RIGHT HAND  Final   Special Requests   Final    BOTTLES DRAWN AEROBIC AND ANAEROBIC Blood Culture adequate volume   Culture NO GROWTH < 12 HOURS  Final   Report Status PENDING  Incomplete     Studies: Dg Chest 2 View  Result Date: 02/12/2017 CLINICAL DATA:  Productive cough.  Shortness of breath. EXAM: CHEST  2 VIEW COMPARISON:  Radiograph 11/05/2016 FINDINGS: Unchanged cardiomegaly and  tortuous atherosclerotic aorta. Mild elevation of left hemidiaphragm with associated left basilar atelectasis. No confluent consolidation, pulmonary edema, pleural fluid or pneumothorax. No acute osseous abnormalities are seen. IMPRESSION: Mild left basilar atelectasis secondary to elevated hemidiaphragm. Stable cardiomegaly. Electronically Signed   By: Rubye OaksMelanie  Ehinger M.D.   On: 02/12/2017 23:35   Ct Renal Stone Study  Result Date: 02/13/2017 CLINICAL DATA:  83 year old male with flank pain. EXAM: CT ABDOMEN AND PELVIS WITHOUT CONTRAST TECHNIQUE: Multidetector CT imaging of the abdomen and pelvis was performed following the standard protocol without IV contrast. COMPARISON:  CT of the abdomen pelvis dated 11/24/2016 FINDINGS: Evaluation of this exam is limited in the absence of intravenous contrast. Lower chest: Confluent nodular densities primarily in the left lower lobe and to a lesser degree involving the lingula and right middle lobe most consistent with pneumonia. Clinical correlation and follow-up to resolution recommended. There is mild prominence of the main pulmonary trunk suggestive of underlying pulmonary hypertension. There is no intra-abdominal free air or free fluid. Hepatobiliary: Probable mild fatty infiltration of the liver. No intrahepatic biliary ductal dilatation. There multiple stones within the gallbladder. No pericholecystic fluid or evidence of acute cholecystitis by CT. Ultrasound may provide better evaluation of the gallbladder if clinically indicated. Pancreas: Unremarkable. No pancreatic ductal dilatation or surrounding inflammatory changes. Spleen: Normal in size without focal abnormality. Adrenals/Urinary Tract: There is a 15 mm left adrenal adenoma. The right adrenal gland is unremarkable. There is no hydronephrosis or nephrolithiasis on either side. Right renal upper pole as well as lower pole hypodense lesions measure up to 6 cm in the inferior pole of the right kidney. These  lesions are not well characterized on this unenhanced CT but demonstrate fluid attenuation most consistent with cysts. The inferior pole cyst may be slightly complex with internal septations. Ultrasound may provide better evaluation. The urinary bladder the visualized ureters are grossly unremarkable. Stomach/Bowel: There is redundancy of the sigmoid colon. There is moderate amount of stool throughout the colon. There postsurgical changes of partial small bowel resection with anastomotic suture in the right lower quadrant. There is no bowel obstruction or active inflammation. The appendix is not visualized, likely surgically absent. There is a 15 mm diverticulum at the gastric fundus. No associated inflammatory changes. Vascular/Lymphatic: There is moderate aortoiliac atherosclerotic disease. There is mild aneurysmal dilatation of the abdominal aorta at the level of the renal arteries measuring up to 3.2 cm in diameter. The IVC appears unremarkable. No portal venous gas identified. There is no adenopathy. Reproductive: Mild enlargement of the prostate gland with median lobe hypertrophy indenting the base of the bladder. Other: There is a midline vertical anterior abdominal wall incisional scar. There is abutment of multiple loops of small  bowel to the anterior peritoneal wall along the surgical scar compatible with adhesions. Musculoskeletal: Osteopenia with degenerative changes of the spine and hips. No acute osseous pathology. IMPRESSION: 1. Pneumonia primarily involving the left lung. Correlation with clinical exam and follow-up to resolution recommended. 2. Mildly dilated main pulmonary trunk suggestive of underlying pulmonary hypertension. 3. Cholelithiasis.  No CT evidence to suggest acute cholecystitis. 4. Postsurgical changes of the bowel. No bowel obstruction or active inflammation. 5. A 3.2 cm abdominal aortic aneurysm Recommend followup by ultrasound in 3 years. This recommendation follows ACR consensus  guidelines: White Paper of the ACR Incidental Findings Committee II on Vascular Findings. J Am Coll Radiol 2013; 16:109-604 6.  Aortic Atherosclerosis (ICD10-I70.0). Electronically Signed   By: Elgie Collard M.D.   On: 02/13/2017 00:20    Scheduled Meds: . allopurinol  300 mg Oral Daily  . amLODipine  2.5 mg Oral Daily  . cholecalciferol  1,000 Units Oral Daily  . docusate sodium  100 mg Oral BID  . enoxaparin (LOVENOX) injection  40 mg Subcutaneous Q24H  . furosemide  40 mg Oral Daily  . ramipril  10 mg Oral BID  . trimethoprim-polymyxin b  1 drop Both Eyes Q6H   Continuous Infusions: . sodium chloride 30 mL/hr at 02/13/17 0745    Assessment/Plan:  1. Viral pneumonia.  Pro-calcitonin negative so antibiotics were stopped.  Start DuoNeb nebulizer solution and budesonide nebulizers.  Gentle IV fluids.  Respiratory panel still pending.  Influenza negative. 2. Weakness.  Physical therapy evaluation 3. Conjunctivitis likely viral in nature but started on antibiotic drop by admitting physician 4. Essential hypertension blood pressure elevated currently.  Continue current medications and monitor 5. History of gout on allopurinol 6. Hyperlipidemia unspecified hold Zocor at this time.  Could be contributing to his weakness.  Code Status:     Code Status Orders        Start     Ordered   02/13/17 0550  Full code  Continuous     02/13/17 0549    Code Status History    Date Active Date Inactive Code Status Order ID Comments User Context   11/04/2016  5:29 PM 11/11/2016  7:04 PM Full Code [id number]  Erin Fulling, MD Inpatient   11/04/2016  5:07 PM 11/04/2016  5:29 PM Full Code [id number]  Leafy Ro, MD Inpatient     Family Communication: Family at the bedside Disposition Plan: Potentially home tomorrow  Antibiotics: -Stopped  Time spent: 35 minutes  Alford Highland  [employer]

## 2017-02-13 NOTE — ED Notes (Signed)
Patient transported to CT 

## 2017-02-13 NOTE — Care Management Obs Status (Signed)
MEDICARE OBSERVATION STATUS NOTIFICATION   Patient Details  Name: Louis Lawson MRN: [id number] Date of Birth: 01/24/1923   Medicare Observation Status Notification Given:  Yes    Gwenette GreetBrenda S Ja Ohman, RN 02/13/2017, 12:13 PM

## 2017-02-13 NOTE — Care Management Note (Signed)
Case Management Note  Patient Details  Name: Louis Lawson Rothermel MRN: [id number] Date of Birth: 1922/12/04  Subjective/Objective:                   Admitted to [facility] with the diagnosis of pneumonia, lives with wife. Daughter is Eunice BlaseDebbie [phone]). Last seen at [facility] in the Spring. Prescriptions are filled at [facility]. States he has had Home health in the home until about a week and half ago. Doesn't remember name of agency. Skilled Nursing x 5 days in the past. Doesn't remember name of facility.  No home oxygen. Rolling walker and wheelchair in the home. Takes care of all basic activities of daily living himself, doesn't drive anymore, but still has license. No falls. Good Appetite. Family will transport.   Action/Plan:  Will continue to follow for discharge needs   Expected Discharge Date:                  Expected Discharge Plan:     In-House Referral:   no  Discharge planning Services     Post Acute Care Choice:    Choice offered to:     DME Arranged:    DME Agency:     HH Arranged:    HH Agency:     Status of Service:     If discussed at [employer] Length of Stay Meetings, dates discussed:    Additional Comments:  Gwenette GreetBrenda S Blain Hunsucker, RN MSN CCM Care management [phone] 02/13/2017, 12:13 PM

## 2017-02-13 NOTE — Progress Notes (Signed)
Pharmacy Antibiotic Note  Louis Lawson is a 83 y.o. male admitted on 02/12/2017 with pneumonia.  Pharmacy has been consulted for azithromycin and ceftriaxone dosing.  Plan: Azithromycin 500 mg daily and ceftriaxone 1 gram daily ordered.  Height: 5' 9.5" (176.5 cm) Weight: 225 lb (102.1 kg) IBW/kg (Calculated) : 71.85  Temp (24hrs), Avg:98 F (36.7 C), Min:98 F (36.7 C), Max:98 F (36.7 C)   Recent Labs Lab 02/12/17 2305  WBC 9.4  CREATININE 1.09    Estimated Creatinine Clearance: 51.4 mL/min (by C-G formula based on SCr of 1.09 mg/dL).    No Known Allergies  Antimicrobials this admission: Azithromycin, ceftriaxone 11/2  >>    >>   Dose adjustments this admission:   Microbiology results: 11/2 BCx: pending     Thank you for allowing pharmacy to be a part of this patient's care.  Maclane Holloran S 02/13/2017 12:38 AM

## 2017-02-13 NOTE — H&P (Signed)
Louis Lawson is an 83 y.o. male.   Chief Complaint: Hematuria HPI: The patient with past medical history of hypertension and coronary artery disease presents to the emergency department complaining of blood in his urine.  The patient states that he passed blood painlessly but was very concerned which prompted him to seek evaluation in the emergency department.  CT stone protocol showed no renal calculi but did reveal a left lower lobe pneumonia.  Patient saturations were intermittently borderline on room air and the patient was mildly tachypneic at baseline though he denied shortness of breath.  He was given IV antibiotics in the emergency department before the hospitalist service was called for admission.  Past Medical History:  Diagnosis Date  . Coronary artery disease   . Hypertension   . Irregular heart beat     Past Surgical History:  Procedure Laterality Date  . APPENDECTOMY N/A 11/04/2016   Procedure: APPENDECTOMY;  Surgeon: Jules Husbands, MD;  Location: [facility] ORS;  Service: General;  Laterality: N/A;  . LAPAROTOMY N/A 11/04/2016   Procedure: EXPLORATORY LAPAROTOMY;  Surgeon: Jules Husbands, MD;  Location: [facility] ORS;  Service: General;  Laterality: N/A;  . UMBILICAL HERNIA REPAIR N/A 11/04/2016   Procedure: HERNIA REPAIR UMBILICAL ADULT;  Surgeon: Jules Husbands, MD;  Location: [facility] ORS;  Service: General;  Laterality: N/A;  . VASCULAR SURGERY Right    Lower Extremity- secondary to DVT    Family History  Problem Relation Age of Onset  . Hypertension Sister    Social History:  reports that he quit smoking about 52 years ago. He has never used smokeless tobacco. He reports that he drinks alcohol. He reports that he does not use drugs.  Allergies: No Known Allergies   (Not in a hospital admission)  Results for orders placed or performed during the hospital encounter of 02/12/17 (from the past 48 hour(s))  Urinalysis, Complete w Microscopic     Status: Abnormal   Collection Time:  02/12/17 10:55 PM  Result Value Ref Range   Color, Urine YELLOW (A) YELLOW   APPearance CLEAR (A) CLEAR   Specific Gravity, Urine 1.010 1.005 - 1.030   pH 7.0 5.0 - 8.0   Glucose, UA NEGATIVE NEGATIVE mg/dL   Hgb urine dipstick MODERATE (A) NEGATIVE   Bilirubin Urine NEGATIVE NEGATIVE   Ketones, ur NEGATIVE NEGATIVE mg/dL   Protein, ur 100 (A) NEGATIVE mg/dL   Nitrite NEGATIVE NEGATIVE   Leukocytes, UA NEGATIVE NEGATIVE   RBC / HPF TOO NUMEROUS TO COUNT 0 - 5 RBC/hpf   WBC, UA 0-5 0 - 5 WBC/hpf   Bacteria, UA NONE SEEN NONE SEEN   Squamous Epithelial / LPF 0-5 (A) NONE SEEN   Mucus PRESENT   Basic metabolic panel     Status: Abnormal   Collection Time: 02/12/17 11:05 PM  Result Value Ref Range   Sodium 135 135 - 145 mmol/L   Potassium 4.3 3.5 - 5.1 mmol/L    Comment: HEMOLYSIS AT THIS LEVEL MAY AFFECT RESULT   Chloride 98 (L) 101 - 111 mmol/L   CO2 27 22 - 32 mmol/L   Glucose, Bld 133 (H) 65 - 99 mg/dL   BUN 19 6 - 20 mg/dL   Creatinine, Ser 1.09 0.61 - 1.24 mg/dL   Calcium 8.7 (L) 8.9 - 10.3 mg/dL   GFR calc non Af Amer 57 (L) >60 mL/min   GFR calc Af Amer >60 >60 mL/min    Comment: (NOTE) The eGFR has been calculated  using the CKD EPI equation. This calculation has not been validated in all clinical situations. eGFR's persistently <60 mL/min signify possible Chronic Kidney Disease.    Anion gap 10 5 - 15  Hepatic function panel     Status: Abnormal   Collection Time: 02/12/17 11:05 PM  Result Value Ref Range   Total Protein 8.2 (H) 6.5 - 8.1 g/dL   Albumin 3.2 (L) 3.5 - 5.0 g/dL   AST 24 15 - 41 U/L   ALT 14 (L) 17 - 63 U/L   Alkaline Phosphatase 64 38 - 126 U/L   Total Bilirubin 0.8 0.3 - 1.2 mg/dL   Bilirubin, Direct 0.3 0.1 - 0.5 mg/dL   Indirect Bilirubin 0.5 0.3 - 0.9 mg/dL  Troponin I     Status: None   Collection Time: 02/12/17 11:05 PM  Result Value Ref Range   Troponin I <0.03 <0.03 ng/mL  CBC with Differential     Status: Abnormal   Collection Time:  02/12/17 11:05 PM  Result Value Ref Range   WBC 9.4 3.8 - 10.6 K/uL   RBC 3.78 (L) 4.40 - 5.90 MIL/uL   Hemoglobin 11.4 (L) 13.0 - 18.0 g/dL   HCT 34.5 (L) 40.0 - 52.0 %   MCV 91.3 80.0 - 100.0 fL   MCH 30.1 26.0 - 34.0 pg   MCHC 33.0 32.0 - 36.0 g/dL   RDW 15.2 (H) 11.5 - 14.5 %   Platelets 269 150 - 440 K/uL   Neutrophils Relative % 58 %   Neutro Abs 5.4 1.4 - 6.5 K/uL   Lymphocytes Relative 24 %   Lymphs Abs 2.3 1.0 - 3.6 K/uL   Monocytes Relative 15 %   Monocytes Absolute 1.4 (H) 0.2 - 1.0 K/uL   Eosinophils Relative 2 %   Eosinophils Absolute 0.2 0 - 0.7 K/uL   Basophils Relative 1 %   Basophils Absolute 0.1 0 - 0.1 K/uL  Influenza panel by PCR (type A & B)     Status: None   Collection Time: 02/12/17 11:24 PM  Result Value Ref Range   Influenza A By PCR NEGATIVE NEGATIVE   Influenza B By PCR NEGATIVE NEGATIVE    Comment: (NOTE) The Xpert Xpress Flu assay is intended as an aid in the diagnosis of  influenza and should not be used as a sole basis for treatment.  This  assay is FDA approved for nasopharyngeal swab specimens only. Nasal  washings and aspirates are unacceptable for Xpert Xpress Flu testing.   Lactic acid, plasma     Status: None   Collection Time: 02/13/17 12:40 AM  Result Value Ref Range   Lactic Acid, Venous 1.2 0.5 - 1.9 mmol/L  Procalcitonin     Status: None   Collection Time: 02/13/17 12:40 AM  Result Value Ref Range   Procalcitonin <0.10 ng/mL    Comment:        Interpretation: PCT (Procalcitonin) <= 0.5 ng/mL: Systemic infection (sepsis) is not likely. Local bacterial infection is possible. (NOTE)         ICU PCT Algorithm               Non ICU PCT Algorithm    ----------------------------     ------------------------------         PCT < 0.25 ng/mL                 PCT < 0.1 ng/mL     Stopping of antibiotics  Stopping of antibiotics       strongly encouraged.               strongly encouraged.    ----------------------------      ------------------------------       PCT level decrease by               PCT < 0.25 ng/mL       >= 80% from peak PCT       OR PCT 0.25 - 0.5 ng/mL          Stopping of antibiotics                                             encouraged.     Stopping of antibiotics           encouraged.    ----------------------------     ------------------------------       PCT level decrease by              PCT >= 0.25 ng/mL       < 80% from peak PCT        AND PCT >= 0.5 ng/mL            Continuin g antibiotics                                              encouraged.       Continuing antibiotics            encouraged.    ----------------------------     ------------------------------     PCT level increase compared          PCT > 0.5 ng/mL         with peak PCT AND          PCT >= 0.5 ng/mL             Escalation of antibiotics                                          strongly encouraged.      Escalation of antibiotics        strongly encouraged.    Dg Chest 2 View  Result Date: 02/12/2017 CLINICAL DATA:  Productive cough.  Shortness of breath. EXAM: CHEST  2 VIEW COMPARISON:  Radiograph 11/05/2016 FINDINGS: Unchanged cardiomegaly and tortuous atherosclerotic aorta. Mild elevation of left hemidiaphragm with associated left basilar atelectasis. No confluent consolidation, pulmonary edema, pleural fluid or pneumothorax. No acute osseous abnormalities are seen. IMPRESSION: Mild left basilar atelectasis secondary to elevated hemidiaphragm. Stable cardiomegaly. Electronically Signed   By: Jeb Levering M.D.   On: 02/12/2017 23:35   Ct Renal Stone Study  Result Date: 02/13/2017 CLINICAL DATA:  83 year old male with flank pain. EXAM: CT ABDOMEN AND PELVIS WITHOUT CONTRAST TECHNIQUE: Multidetector CT imaging of the abdomen and pelvis was performed following the standard protocol without IV contrast. COMPARISON:  CT of the abdomen pelvis dated 11/24/2016 FINDINGS: Evaluation of this exam is limited in the  absence of intravenous contrast. Lower chest: Confluent nodular densities primarily in the left lower lobe and to a lesser degree  involving the lingula and right middle lobe most consistent with pneumonia. Clinical correlation and follow-up to resolution recommended. There is mild prominence of the main pulmonary trunk suggestive of underlying pulmonary hypertension. There is no intra-abdominal free air or free fluid. Hepatobiliary: Probable mild fatty infiltration of the liver. No intrahepatic biliary ductal dilatation. There multiple stones within the gallbladder. No pericholecystic fluid or evidence of acute cholecystitis by CT. Ultrasound may provide better evaluation of the gallbladder if clinically indicated. Pancreas: Unremarkable. No pancreatic ductal dilatation or surrounding inflammatory changes. Spleen: Normal in size without focal abnormality. Adrenals/Urinary Tract: There is a 15 mm left adrenal adenoma. The right adrenal gland is unremarkable. There is no hydronephrosis or nephrolithiasis on either side. Right renal upper pole as well as lower pole hypodense lesions measure up to 6 cm in the inferior pole of the right kidney. These lesions are not well characterized on this unenhanced CT but demonstrate fluid attenuation most consistent with cysts. The inferior pole cyst may be slightly complex with internal septations. Ultrasound may provide better evaluation. The urinary bladder the visualized ureters are grossly unremarkable. Stomach/Bowel: There is redundancy of the sigmoid colon. There is moderate amount of stool throughout the colon. There postsurgical changes of partial small bowel resection with anastomotic suture in the right lower quadrant. There is no bowel obstruction or active inflammation. The appendix is not visualized, likely surgically absent. There is a 15 mm diverticulum at the gastric fundus. No associated inflammatory changes. Vascular/Lymphatic: There is moderate aortoiliac  atherosclerotic disease. There is mild aneurysmal dilatation of the abdominal aorta at the level of the renal arteries measuring up to 3.2 cm in diameter. The IVC appears unremarkable. No portal venous gas identified. There is no adenopathy. Reproductive: Mild enlargement of the prostate gland with median lobe hypertrophy indenting the base of the bladder. Other: There is a midline vertical anterior abdominal wall incisional scar. There is abutment of multiple loops of small bowel to the anterior peritoneal wall along the surgical scar compatible with adhesions. Musculoskeletal: Osteopenia with degenerative changes of the spine and hips. No acute osseous pathology. IMPRESSION: 1. Pneumonia primarily involving the left lung. Correlation with clinical exam and follow-up to resolution recommended. 2. Mildly dilated main pulmonary trunk suggestive of underlying pulmonary hypertension. 3. Cholelithiasis.  No CT evidence to suggest acute cholecystitis. 4. Postsurgical changes of the bowel. No bowel obstruction or active inflammation. 5. A 3.2 cm abdominal aortic aneurysm Recommend followup by ultrasound in 3 years. This recommendation follows ACR consensus guidelines: White Paper of the ACR Incidental Findings Committee II on Vascular Findings. J Am Coll Radiol 2013; 14:782-956 6.  Aortic Atherosclerosis (ICD10-I70.0). Electronically Signed   By: Anner Crete M.D.   On: 02/13/2017 00:20    Review of Systems  Constitutional: Negative for chills and fever.  HENT: Positive for congestion. Negative for sore throat and tinnitus.   Eyes: Positive for discharge and redness. Negative for blurred vision.  Respiratory: Positive for cough and sputum production. Negative for shortness of breath.   Cardiovascular: Negative for chest pain, palpitations, orthopnea and PND.  Gastrointestinal: Negative for abdominal pain, diarrhea, nausea and vomiting.  Genitourinary: Negative for dysuria, frequency and urgency.   Musculoskeletal: Negative for joint pain and myalgias.  Skin: Negative for rash.       No lesions  Neurological: Negative for speech change, focal weakness and weakness.  Endo/Heme/Allergies: Does not bruise/bleed easily.       No temperature intolerance  Psychiatric/Behavioral: Negative for depression and suicidal ideas.  Blood pressure (!) 164/69, pulse 71, temperature 98 F (36.7 C), temperature source Oral, resp. rate (!) 22, height 5' 9.5" (1.765 m), weight 102.1 kg (225 lb), SpO2 93 %. Physical Exam  Vitals reviewed. Constitutional: He is oriented to person, place, and time. He appears well-developed and well-nourished. No distress.  HENT:  Head: Normocephalic.  Mouth/Throat: Oropharynx is clear and moist. Mucous membranes are dry.  Eyes: Pupils are equal, round, and reactive to light. EOM are normal. Right eye exhibits discharge. Left eye exhibits discharge. Right conjunctiva is injected. Left conjunctiva is injected. No scleral icterus.  Neck: Normal range of motion. Neck supple. No JVD present. No tracheal deviation present. No thyromegaly present.  Cardiovascular: Normal rate, regular rhythm and normal heart sounds.  Exam reveals no gallop and no friction rub.   No murmur heard. Respiratory: Effort normal and breath sounds normal. No respiratory distress. He has no wheezes.  GI: Soft. Bowel sounds are normal. He exhibits no distension. There is no tenderness.  Genitourinary:  Genitourinary Comments: Deferred  Musculoskeletal: Normal range of motion. He exhibits edema (woody).  Lymphadenopathy:    He has no cervical adenopathy.  Neurological: He is alert and oriented to person, place, and time. No cranial nerve deficit.  Skin: Skin is warm and dry. No rash noted. No erythema.  Chronic venous stasis changes of skin lower extremities b/l  Psychiatric: He has a normal mood and affect. His behavior is normal. Judgment and thought content normal.     Assessment/Plan This is  a 83 year old male admitted for pneumonia. 1.  Pneumonia: Community-acquired; no signs or symptoms of sepsis.  The patient originally received IV azithromycin and ceftriaxone.  We may transition him to oral antibiotics.  No oxygen requirement at this time. 2.  Weakness: The patient has become progressively weaker over the last few days.  He appears clinically dehydrated thus I will give him some IV fluid.  Also PT/OT eval.   3.  Conjunctivitis: Treat bacterial contact Vitas empirically although differential diagnosis includes viral etiology. 4.  CAD: Stable; manage blood pressure. 5.  Hypertension: Intermittently controlled; generally acceptable for age.  Continue amlodipine and ACE inhibitor 6.  Hyperlipidemia: Continue statin therapy 7.  Gout: Stable; continue allopurinol 8.  Venous stasis: Continue Lasix per home regimen 9.  GI prophylaxis: None 10.  DVT prophylaxis: Lovenox The patient is a full code.  Time spent on admission of his inpatient care proximally 45 minutes  Harrie Foreman, MD 02/13/2017, 4:06 AM

## 2017-02-13 NOTE — Evaluation (Signed)
Physical Therapy Evaluation Patient Details Name: Cobi Delph MRN: [id number] DOB: 09-18-1922 Today's Date: 02/13/2017   History of Present Illness  83 y.o. male admitted with Community Acquired Pneumonia of the left lower lobe. Pt. PMHx includes: CAD, HTN, Irregular Heartbeat. Past surgical History includes: appendectomy, laparatomy, umbilical hernia repair, and vascular surgery.   Clinical Impression  Pt reports feeling that he was more functional and safe than he actually was able to do.  He had very stooped posture, impulsive and staggering steppage and constantly needed to use his L hand (cane in R) to maintain balance holding on to anything he could.  Pt needed heavy cuing and assist to stay safe and though he realizes he is weaker than his normal he essentially was refusing the idea of going to PT.  He showed poor awareness, but did ultimately admit that he would not be safe at home given today's performance.  Pt suggesting STR.     Follow Up Recommendations SNF (pt indicated that he is not interested in going to rehab)    Equipment Recommendations   (pt stated that he does have a walker )    Recommendations for Other Services       Precautions / Restrictions Precautions Precautions: Fall Restrictions Weight Bearing Restrictions: No      Mobility  Bed Mobility Overal bed mobility: Needs Assistance Bed Mobility: Sit to Supine       Sit to supine: Mod assist   General bed mobility comments: Pt was very limited with ability to get to EOB without significant assist.  He was eager to do as much on his own, but as soon as PT would stop helping he would either fall back or to his right and he needed heavy assist to get to actual upright enough to maintain position  Transfers Overall transfer level: Needs assistance Equipment used: Straight cane Transfers: Sit to/from Stand Sit to Stand: Min assist         General transfer comment: Pt did not need a lot of assist to  actually get to standing but instantly was unsteady once up right.  He needed to grab counter/wall and generally showed poor awareness.   Ambulation/Gait Ambulation/Gait assistance: Mod assist Ambulation Distance (Feet): 25 Feet Assistive device: Straight cane       General Gait Details: Pt was much more confident with ambulation than his balance would reasonably indicate.  He needed a second hand on some support nearly the entire time, had extremely stooped posture and had very inconsistent and staggered steps t/o the effort.   Stairs            Wheelchair Mobility    Modified Rankin (Stroke Patients Only)       Balance Overall balance assessment: Needs assistance Sitting-balance support: Bilateral upper extremity supported Sitting balance-Leahy Scale: Poor Sitting balance - Comments: Pt struggled to stay upright at EOB, once PT assisted considerably to attain position he did show some ability to stay upright w/o constant assist     Standing balance-Leahy Scale: Poor Standing balance comment: Pt heavily reliant on cane as well as often needing L hand on something.  He was impulsive and unsteady and ultimately did not show good safety awareness in standing                             Pertinent Vitals/Pain Pain Assessment:  (denies pain, but then c/o cramps in ankles t/o session)  Home Living Family/patient expects to be discharged to:: Skilled nursing facility Living Arrangements: Spouse/significant other Available Help at Discharge: Family Type of Home: House Home Access: Stairs to enter Entrance Stairs-Rails: Right Entrance Stairs-Number of Steps: 3 Home Layout: One level Home Equipment: Grab bars - tub/shower;Walker - 2 wheels      Prior Function Level of Independence: Independent with assistive device(s)         Comments: Pt rarely out of the house, reports he does fine with in home ADLs, etc?     Hand Dominance   Dominant Hand:  Right    Extremity/Trunk Assessment   Upper Extremity Assessment Upper Extremity Assessment: Generalized weakness    Lower Extremity Assessment Lower Extremity Assessment: Generalized weakness    Cervical / Trunk Assessment Cervical / Trunk Assessment:  (forward flexed t/o the spine)  Communication   Communication: No difficulties  Cognition Arousal/Alertness: Awake/alert Behavior During Therapy: Impulsive Overall Cognitive Status: Difficult to assess                                 General Comments: Pt needs frequent re-orienting to stay on task and to realize how unsteady/unsafe he is with mobility      General Comments      Exercises     Assessment/Plan    PT Assessment Patient needs continued PT services  PT Problem List Decreased strength;Decreased activity tolerance;Decreased balance;Decreased range of motion;Decreased mobility;Decreased coordination;Decreased cognition;Decreased knowledge of use of DME;Decreased safety awareness       PT Treatment Interventions Gait training;Stair training;Functional mobility training;Therapeutic activities;Therapeutic exercise;DME instruction;Balance training;Neuromuscular re-education;Cognitive remediation;Patient/family education    PT Goals (Current goals can be found in the Care Plan section)  Acute Rehab PT Goals Patient Stated Goal: To return home PT Goal Formulation: With patient Time For Goal Achievement: 02/27/17 Potential to Achieve Goals: Fair    Frequency Min 2X/week   Barriers to discharge        Co-evaluation               AM-PAC PT "6 Clicks" Daily Activity  Outcome Measure Difficulty turning over in bed (including adjusting bedclothes, sheets and blankets)?: A Lot Difficulty moving from lying on back to sitting on the side of the bed? : Unable Difficulty sitting down on and standing up from a chair with arms (e.g., wheelchair, bedside commode, etc,.)?: Unable Help needed moving to  and from a bed to chair (including a wheelchair)?: A Little Help needed walking in hospital room?: A Lot Help needed climbing 3-5 steps with a railing? : A Lot 6 Click Score: 11    End of Session Equipment Utilized During Treatment: Gait belt Activity Tolerance: Patient limited by fatigue Patient left: with bed alarm set;with call bell/phone within reach   PT Visit Diagnosis: Muscle weakness (generalized) (M62.81);Difficulty in walking, not elsewhere classified (R26.2)    Time: [id number] PT Time Calculation (min) (ACUTE ONLY): 25 min   Charges:   PT Evaluation $PT Eval Low Complexity: 1 Low     PT G Codes:   PT G-Codes **NOT FOR INPATIENT CLASS** Functional Assessment Tool Used: AM-PAC 6 Clicks Basic Mobility Functional Limitation: Mobility: Walking and moving around Mobility: Walking and Moving Around Current Status ([id number]): At least 60 percent but less than 80 percent impaired, limited or restricted Mobility: Walking and Moving Around Goal Status [phone]([id number]): At least 20 percent but less than 40 percent impaired, limited or restricted  Malachi Pro, DPT 02/13/2017, 2:51 PM

## 2017-02-14 ENCOUNTER — Encounter: Payer: Self-pay | Admitting: Internal Medicine

## 2017-02-14 LAB — BASIC METABOLIC PANEL
ANION GAP: 10 (ref 5–15)
BUN: 15 mg/dL (ref 6–20)
CALCIUM: 8.6 mg/dL — AB (ref 8.9–10.3)
CO2: 28 mmol/L (ref 22–32)
Chloride: 98 mmol/L — ABNORMAL LOW (ref 101–111)
Creatinine, Ser: 1 mg/dL (ref 0.61–1.24)
Glucose, Bld: 121 mg/dL — ABNORMAL HIGH (ref 65–99)
POTASSIUM: 3.6 mmol/L (ref 3.5–5.1)
Sodium: 136 mmol/L (ref 135–145)

## 2017-02-14 LAB — CBC
HEMATOCRIT: 35.4 % — AB (ref 40.0–52.0)
Hemoglobin: 11.8 g/dL — ABNORMAL LOW (ref 13.0–18.0)
MCH: 30.8 pg (ref 26.0–34.0)
MCHC: 33.3 g/dL (ref 32.0–36.0)
MCV: 92.4 fL (ref 80.0–100.0)
PLATELETS: 292 10*3/uL (ref 150–440)
RBC: 3.84 MIL/uL — AB (ref 4.40–5.90)
RDW: 15 % — AB (ref 11.5–14.5)
WBC: 8.2 10*3/uL (ref 3.8–10.6)

## 2017-02-14 LAB — PROCALCITONIN: Procalcitonin: <0.1 ng/mL

## 2017-02-14 MED ORDER — IPRATROPIUM-ALBUTEROL 0.5-2.5 (3) MG/3ML IN SOLN: 3.0000 mL | Freq: Two times a day (BID) | RESPIRATORY_TRACT | Status: DC

## 2017-02-14 MED ORDER — GUAIFENESIN-DM 100-10 MG/5ML PO SYRP: 5.0000 mL | ORAL_SOLUTION | ORAL | 0 refills | Status: AC | PRN

## 2017-02-14 MED ORDER — POLYMYXIN B-TRIMETHOPRIM 10000-0.1 UNIT/ML-% OP SOLN: 1.0000 [drp] | Freq: Four times a day (QID) | OPHTHALMIC | 0 refills | Status: AC

## 2017-02-14 MED ORDER — LEVOFLOXACIN 500 MG PO TABS: 500.0000 mg | ORAL_TABLET | Freq: Every day | ORAL | 0 refills | Status: AC

## 2017-02-14 NOTE — Discharge Instructions (Signed)
[facility] Physical Therapy °

## 2017-02-14 NOTE — Care Management Note (Signed)
Case Management Note  Patient Details  Name: Louis Lawson MRN: [id number] Date of Birth: Oct 31, 1922  Subjective/Objective:         Discussed discharge planning with [name] who chose [facility]. A referral for home health PT was called to GordonJermaine at [facility]. [name] already has a RW.           Action/Plan:   Expected Discharge Date:  02/14/17               Expected Discharge Plan:  Home w Home Health Services  In-House Referral:  NA  Discharge planning Services  CM Consult  Post Acute Care Choice:  Home Health Choice offered to:  Patient  DME Arranged:  N/A DME Agency:  NA  HH Arranged:  PT HH Agency:  [facility]  Status of Service:  Completed, signed off  If discussed at Long Length of Stay Meetings, dates discussed:    Additional Comments:  Niyam Bisping A, RN 02/14/2017, 10:57 AM

## 2017-02-14 NOTE — Progress Notes (Signed)
[facility] with home health PT today; Care Management previously established home health PT with [facility]; verbally reviewed AVS with pt and pt's son, Sherrill Raringyson Durfey; no questions voiced at this time; pt discharged via wheelchair by nursing to the visitor's entrance

## 2017-02-17 NOTE — Discharge Summary (Signed)
[facility] [place] [facility]   PATIENT NAME: Louis Lawson    MR#:  [id number]  DATE OF BIRTH:  Jul 31, 1922  DATE OF ADMISSION:  02/12/2017 ADMITTING PHYSICIAN: Arnaldo NatalMichael S Diamond, MD  DATE OF DISCHARGE: 02/14/2017  3:29 PM  PRIMARY CARE PHYSICIAN: [facility]   ADMISSION DIAGNOSIS:  Community acquired pneumonia of left lower lobe of lung (HCC) [J18.1]  DISCHARGE DIAGNOSIS:  Active Problems:   CAP (community acquired pneumonia)   SECONDARY DIAGNOSIS:   Past Medical History:  Diagnosis Date  . Coronary artery disease   . Hypertension   . Irregular heart beat      ADMITTING HISTORY  Louis Lawson is an 83 y.o. male.   Chief Complaint: Hematuria HPI: The patient with past medical history of hypertension and coronary artery disease presents to the emergency department complaining of blood in his urine.  The patient states that he passed blood painlessly but was very concerned which prompted him to seek evaluation in the emergency department.  CT stone protocol showed no renal calculi but did reveal a left lower lobe pneumonia.  Patient saturations were intermittently borderline on room air and the patient was mildly tachypneic at baseline though he denied shortness of breath.  He was given IV antibiotics in the emergency department before the hospitalist service was called for admission.    HOSPITAL COURSE:   *Left lower lobe pneumonia *Acute conjunctivitis *Generalized weakness *Hypertension  Patient was admitted to the hospitalist service on IV antibiotics.  Did not need any oxygen.  He worked with physical therapy.  Skilled nursing facility was recommended by physical therapy.  Patient chose to go home with home health. Discharged home on oral Levaquin and eyedrops.  Home health physical therapy set up.  Stable for discharge home.  CONSULTS OBTAINED:    DRUG ALLERGIES:  No Known Allergies  DISCHARGE MEDICATIONS:   Discharge  Medication List as of 02/14/2017  1:20 PM    START taking these medications   Details  guaiFENesin-dextromethorphan (ROBITUSSIN DM) 100-10 MG/5ML syrup Take 5 mLs by mouth every 4 (four) hours as needed for cough., Starting Sat 02/14/2017, Normal    levofloxacin (LEVAQUIN) 500 MG tablet Take 1 tablet (500 mg total) by mouth daily., Starting Sat 02/14/2017, Until Wed 02/18/2017, Normal    trimethoprim-polymyxin b (POLYTRIM) ophthalmic solution Place 1 drop into both eyes every 6 (six) hours., Starting Sat 02/14/2017, Until Wed 02/18/2017, Normal      CONTINUE these medications which have NOT CHANGED   Details  acetaminophen (TYLENOL) 325 MG tablet Take 2 tablets (650 mg total) by mouth every 4 (four) hours as needed for mild pain (temp > 101.5)., Starting Tue 11/11/2016, No Print    allopurinol (ZYLOPRIM) 300 MG tablet Take 1 tablet by mouth daily., Starting Wed 02/27/2016, Historical Med    amLODipine (NORVASC) 10 MG tablet Take 1 tablet by mouth daily., Starting Wed 02/27/2016, Historical Med    cholecalciferol (VITAMIN D) 1000 units tablet Take 1,000 Units by mouth daily. , Historical Med    furosemide (LASIX) 40 MG tablet Take 40 mg by mouth daily. , Historical Med    ondansetron (ZOFRAN ODT) 4 MG disintegrating tablet Take 1 tablet (4 mg total) by mouth every 8 (eight) hours as needed for nausea or vomiting., Starting Mon 11/24/2016, Print    ramipril (ALTACE) 10 MG capsule Take 10 mg by mouth 2 (two) times daily. , Historical Med    simvastatin (ZOCOR) 20 MG tablet Take 20  mg by mouth daily., Historical Med        Today   VITAL SIGNS:  Blood pressure (!) 150/70, pulse 76, temperature (!) 97.4 F (36.3 C), resp. rate 20, height 5' 9.5" (1.765 m), weight 95.9 kg (211 lb 6.4 oz), SpO2 92 %.  I/O:  No intake or output data in the 24 hours ending 02/17/17 1422  PHYSICAL EXAMINATION:  Physical Exam  GENERAL:  83 y.o.-year-old patient lying in the bed with no acute distress.   LUNGS: Normal breath sounds bilaterally, no wheezing, rales,rhonchi or crepitation. No use of accessory muscles of respiration.  CARDIOVASCULAR: S1, S2 normal. No murmurs, rubs, or gallops.  ABDOMEN: Soft, non-tender, non-distended. Bowel sounds present. No organomegaly or mass.  NEUROLOGIC: Moves all 4 extremities. PSYCHIATRIC: The patient is alert and oriented x 3.  SKIN: No obvious rash, lesion, or ulcer.   DATA REVIEW:   CBC Recent Labs  Lab 02/14/17 0509  WBC 8.2  HGB 11.8*  HCT 35.4*  PLT 292    Chemistries  Recent Labs  Lab 02/12/17 2305 02/14/17 0509  NA 135 136  K 4.3 3.6  CL 98* 98*  CO2 27 28  GLUCOSE 133* 121*  BUN 19 15  CREATININE 1.09 1.00  CALCIUM 8.7* 8.6*  AST 24  --   ALT 14*  --   ALKPHOS 64  --   BILITOT 0.8  --     Cardiac Enzymes Recent Labs  Lab 02/12/17 2305  TROPONINI <0.03    Microbiology Results  Results for orders placed or performed during the hospital encounter of 02/12/17  Blood Culture (routine x 2)     Status: None (Preliminary result)   Collection Time: 02/13/17 12:40 AM  Result Value Ref Range Status   Specimen Description BLOOD BLOOD LEFT HAND  Final   Special Requests   Final    BOTTLES DRAWN AEROBIC AND ANAEROBIC Blood Culture adequate volume   Culture NO GROWTH 4 DAYS  Final   Report Status PENDING  Incomplete  Blood Culture (routine x 2)     Status: None (Preliminary result)   Collection Time: 02/13/17 12:40 AM  Result Value Ref Range Status   Specimen Description BLOOD BLOOD RIGHT HAND  Final   Special Requests   Final    BOTTLES DRAWN AEROBIC AND ANAEROBIC Blood Culture adequate volume   Culture NO GROWTH 4 DAYS  Final   Report Status PENDING  Incomplete  Respiratory Panel by PCR     Status: None   Collection Time: 02/13/17  7:25 AM  Result Value Ref Range Status   Adenovirus NOT DETECTED NOT DETECTED Final   Coronavirus 229E NOT DETECTED NOT DETECTED Final   Coronavirus HKU1 NOT DETECTED NOT DETECTED  Final   Coronavirus NL63 NOT DETECTED NOT DETECTED Final   Coronavirus OC43 NOT DETECTED NOT DETECTED Final   Metapneumovirus NOT DETECTED NOT DETECTED Final   Rhinovirus / Enterovirus NOT DETECTED NOT DETECTED Final   Influenza A NOT DETECTED NOT DETECTED Final   Influenza B NOT DETECTED NOT DETECTED Final   Parainfluenza Virus 1 NOT DETECTED NOT DETECTED Final   Parainfluenza Virus 2 NOT DETECTED NOT DETECTED Final   Parainfluenza Virus 3 NOT DETECTED NOT DETECTED Final   Parainfluenza Virus 4 NOT DETECTED NOT DETECTED Final   Respiratory Syncytial Virus NOT DETECTED NOT DETECTED Final   Bordetella pertussis NOT DETECTED NOT DETECTED Final   Chlamydophila pneumoniae NOT DETECTED NOT DETECTED Final   Mycoplasma pneumoniae NOT DETECTED NOT DETECTED  Final    Comment: Performed at [facility], [address] [place], [place] [address]    RADIOLOGY:  No results found.  Follow up with PCP in 1 week.  Management plans discussed with the patient, family and they are in agreement.  CODE STATUS:  Code Status History    Date Active Date Inactive Code Status Order ID Comments User Context   02/13/2017 05:49 02/14/2017 18:35 Full Code [id number]  Arnaldo Nataliamond, Michael S, MD Inpatient   11/04/2016 17:29 11/11/2016 19:04 Full Code [id number]  Erin FullingKasa, Kurian, MD Inpatient   11/04/2016 17:07 11/04/2016 17:29 Full Code [id number]  Leafy RoPabon, Diego F, MD Inpatient      TOTAL TIME TAKING CARE OF THIS PATIENT ON DAY OF DISCHARGE: more than 30 minutes.   Milagros LollSudini, Steffany Schoenfelder R M.D on 02/17/2017 at 2:22 PM  Between 7am to 6pm - Pager - [phone]  After 6pm go to [email or web address] - password [email or web address]  [facility] [place] [facility]  Office  [phone]  CC: Primary care physician; [facility]  Note: This dictation was prepared with Dragon dictation along with smaller phrase technology. Any transcriptional errors that result from this process are unintentional.

## 2017-02-18 LAB — CULTURE, BLOOD (ROUTINE X 2)
CULTURE: NO GROWTH
CULTURE: NO GROWTH
SPECIAL REQUESTS: ADEQUATE
Special Requests: ADEQUATE

## 2017-04-10 ENCOUNTER — Emergency Department: Payer: Medicare HMO

## 2017-04-10 ENCOUNTER — Inpatient Hospital Stay
Admission: EM | Admit: 2017-04-10 | Discharge: 2017-04-14 | DRG: 291 | Disposition: E | Payer: Medicare HMO | Attending: Internal Medicine | Admitting: Internal Medicine

## 2017-04-10 ENCOUNTER — Inpatient Hospital Stay
Admission: AD | Admit: 2017-04-10 | Payer: Medicare HMO | Source: Other Acute Inpatient Hospital | Admitting: Critical Care Medicine

## 2017-04-10 DIAGNOSIS — R4182 Altered mental status, unspecified: Secondary | ICD-10-CM | POA: Diagnosis not present

## 2017-04-10 DIAGNOSIS — N179 Acute kidney failure, unspecified: Secondary | ICD-10-CM | POA: Diagnosis present

## 2017-04-10 DIAGNOSIS — I2510 Atherosclerotic heart disease of native coronary artery without angina pectoris: Secondary | ICD-10-CM | POA: Diagnosis present

## 2017-04-10 DIAGNOSIS — K5900 Constipation, unspecified: Secondary | ICD-10-CM | POA: Diagnosis present

## 2017-04-10 DIAGNOSIS — R570 Cardiogenic shock: Principal | ICD-10-CM | POA: Diagnosis present

## 2017-04-10 DIAGNOSIS — Z66 Do not resuscitate: Secondary | ICD-10-CM | POA: Diagnosis present

## 2017-04-10 DIAGNOSIS — I10 Essential (primary) hypertension: Secondary | ICD-10-CM | POA: Diagnosis present

## 2017-04-10 DIAGNOSIS — J9600 Acute respiratory failure, unspecified whether with hypoxia or hypercapnia: Secondary | ICD-10-CM | POA: Diagnosis present

## 2017-04-10 DIAGNOSIS — Z8249 Family history of ischemic heart disease and other diseases of the circulatory system: Secondary | ICD-10-CM

## 2017-04-10 DIAGNOSIS — E872 Acidosis: Secondary | ICD-10-CM | POA: Diagnosis present

## 2017-04-10 DIAGNOSIS — I469 Cardiac arrest, cause unspecified: Secondary | ICD-10-CM

## 2017-04-10 DIAGNOSIS — G9340 Encephalopathy, unspecified: Secondary | ICD-10-CM | POA: Diagnosis present

## 2017-04-10 DIAGNOSIS — Z87891 Personal history of nicotine dependence: Secondary | ICD-10-CM

## 2017-04-10 DIAGNOSIS — Z515 Encounter for palliative care: Secondary | ICD-10-CM | POA: Diagnosis present

## 2017-04-10 LAB — COMPREHENSIVE METABOLIC PANEL
ALBUMIN: 2.9 g/dL — AB (ref 3.5–5.0)
ALK PHOS: 221 U/L — AB (ref 38–126)
ALT: 17 U/L (ref 17–63)
AST: 53 U/L — ABNORMAL HIGH (ref 15–41)
Anion gap: 18 — ABNORMAL HIGH (ref 5–15)
BUN: 24 mg/dL — ABNORMAL HIGH (ref 6–20)
CO2: 17 mmol/L — AB (ref 22–32)
CREATININE: 2.51 mg/dL — AB (ref 0.61–1.24)
Calcium: 9 mg/dL (ref 8.9–10.3)
Chloride: 103 mmol/L (ref 101–111)
GFR calc Af Amer: 24 mL/min — ABNORMAL LOW (ref >60)
GFR calc non Af Amer: 21 mL/min — ABNORMAL LOW (ref >60)
GLUCOSE: 407 mg/dL — AB (ref 65–99)
Potassium: 3.2 mmol/L — ABNORMAL LOW (ref 3.5–5.1)
SODIUM: 138 mmol/L (ref 135–145)
Total Bilirubin: 0.8 mg/dL (ref 0.3–1.2)
Total Protein: 6.5 g/dL (ref 6.5–8.1)

## 2017-04-10 LAB — CBC WITH DIFFERENTIAL/PLATELET
BASOS ABS: 0.1 10*3/uL (ref 0–0.1)
Basophils Relative: 0 %
Eosinophils Absolute: 0 10*3/uL (ref 0–0.7)
Eosinophils Relative: 0 %
HEMATOCRIT: 41.4 % (ref 40.0–52.0)
HEMOGLOBIN: 12.9 g/dL — AB (ref 13.0–18.0)
LYMPHS PCT: 20 %
Lymphs Abs: 2.9 10*3/uL (ref 1.0–3.6)
MCH: 30.5 pg (ref 26.0–34.0)
MCHC: 31 g/dL — ABNORMAL LOW (ref 32.0–36.0)
MCV: 98.3 fL (ref 80.0–100.0)
MONO ABS: 0.8 10*3/uL (ref 0.2–1.0)
MONOS PCT: 6 %
NEUTROS ABS: 10.8 10*3/uL — AB (ref 1.4–6.5)
Neutrophils Relative %: 74 %
Platelets: 145 10*3/uL — ABNORMAL LOW (ref 150–440)
RBC: 4.22 MIL/uL — ABNORMAL LOW (ref 4.40–5.90)
RDW: 16.1 % — AB (ref 11.5–14.5)
WBC: 14.7 10*3/uL — ABNORMAL HIGH (ref 3.8–10.6)

## 2017-04-10 LAB — TROPONIN I: TROPONIN I: 0.11 ng/mL — AB (ref <0.03)

## 2017-04-10 LAB — LIPASE, BLOOD: Lipase: 51 U/L (ref 11–51)

## 2017-04-10 LAB — BRAIN NATRIURETIC PEPTIDE: B Natriuretic Peptide: 56 pg/mL (ref 0.0–100.0)

## 2017-04-10 LAB — PROTIME-INR
INR: 1.34
Prothrombin Time: 16.5 seconds — ABNORMAL HIGH (ref 11.4–15.2)

## 2017-04-10 MED ORDER — EPINEPHRINE PF 1 MG/10ML IJ SOSY
0.1000 mg | PREFILLED_SYRINGE | Freq: Once | INTRAMUSCULAR | Status: AC
  Administered 2017-04-10: 0.1 mg via INTRAVENOUS

## 2017-04-10 MED ORDER — MIDAZOLAM HCL 5 MG/5ML IJ SOLN
INTRAMUSCULAR | Status: AC
  Filled 2017-04-10: qty 5

## 2017-04-10 MED ORDER — FENTANYL CITRATE (PF) 100 MCG/2ML IJ SOLN
50.0000 ug | Freq: Once | INTRAMUSCULAR | Status: AC
  Administered 2017-04-10: 50 ug via INTRAVENOUS

## 2017-04-10 MED ORDER — SODIUM CHLORIDE 0.9 % IV BOLUS (SEPSIS)
1000.0000 mL | Freq: Once | INTRAVENOUS | Status: AC
  Administered 2017-04-10: 1000 mL via INTRAVENOUS

## 2017-04-10 MED ORDER — MIDAZOLAM HCL 5 MG/5ML IJ SOLN
2.0000 mg | Freq: Once | INTRAMUSCULAR | Status: AC
  Administered 2017-04-10: 2 mg via INTRAVENOUS

## 2017-04-10 MED ORDER — NOREPINEPHRINE BITARTRATE 1 MG/ML IV SOLN
2.0000 ug/min | Freq: Once | INTRAVENOUS | Status: AC
  Administered 2017-04-10: 2 ug/min via INTRAVENOUS
  Filled 2017-04-10 (×4): qty 4

## 2017-04-10 MED ORDER — FENTANYL CITRATE (PF) 100 MCG/2ML IJ SOLN
INTRAMUSCULAR | Status: AC
  Filled 2017-04-10: qty 2

## 2017-04-10 NOTE — ED Notes (Signed)
 1mg  ATROPINE GIVEN at this time

## 2017-04-10 NOTE — ED Notes (Signed)
Per EDP, pulse check, pt has pulse

## 2017-04-10 NOTE — ED Triage Notes (Signed)
Pt from EMS, unresp on arrival, distended abd at this time. CPR started, EDP in rm.

## 2017-04-10 NOTE — ED Notes (Addendum)
EPI GIVEN BY IRIS RN (2nd)

## 2017-04-10 NOTE — ED Provider Notes (Signed)
[facility] Emergency Department Provider Note ____________________________________________   I have reviewed the triage vital signs and the triage nursing note.  HISTORY  Chief Complaint Unresponsive   Historian Level 5 Caveat History Limited by unresponsive, active cpr History from EMS History from daughter and son with him today  HPI Louis Lawson is a 83 y.o. male presents in cardiac arrest, active CPR with bag valve mask by EMS.  EMS report was that the patient had had constipation and then diarrhea and vomiting and was complaining of shortness of breath when they came to him and he went unresponsive in the truck and CPR commenced.  It is not clear whether or not he had any sort of bradycardia event, but there was pulseless electrical activity, no shockable rhythm.  Patient had a left tibia IO catheter and no more than 10 minutes of CPR prior to arrival.    ______________  Additional history is obtained from son and daughter after initial treatments.  They state patient was having constipation and took a diarrhea medicine earlier today without any significant improvement.  Later on this evening he had some nausea with 2 episodes of mild vomiting and then sounded like he was stumbling or slightly confused and was complaining of some trouble breathing which is when they called the ambulance.  They do report he has a history of remote cardiac issues including "angioplasty.  "He has a large scar in his abdomen from a prior appendicitis surgery.    Past Medical History:  Diagnosis Date  . Coronary artery disease   . Hypertension   . Irregular heart beat     Patient Active Problem List   Diagnosis Date Noted  . CAP (community acquired pneumonia) 02/13/2017  . Perforated appendicitis 11/04/2016  . Respiratory failure (HCC) 11/04/2016  . Small bowel perforation (HCC)   . Umbilical hernia without obstruction and without gangrene     Past Surgical History:   Procedure Laterality Date  . APPENDECTOMY N/A 11/04/2016   Procedure: APPENDECTOMY;  Surgeon: Leafy Ro, MD;  Location: [facility] ORS;  Service: General;  Laterality: N/A;  . LAPAROTOMY N/A 11/04/2016   Procedure: EXPLORATORY LAPAROTOMY;  Surgeon: Leafy Ro, MD;  Location: [facility] ORS;  Service: General;  Laterality: N/A;  . UMBILICAL HERNIA REPAIR N/A 11/04/2016   Procedure: HERNIA REPAIR UMBILICAL ADULT;  Surgeon: Leafy Ro, MD;  Location: [facility] ORS;  Service: General;  Laterality: N/A;  . VASCULAR SURGERY Right    Lower Extremity- secondary to DVT    Prior to Admission medications   Medication Sig Start Date End Date Taking? Authorizing Provider  acetaminophen (TYLENOL) 325 MG tablet Take 2 tablets (650 mg total) by mouth every 4 (four) hours as needed for mild pain (temp > 101.5). 11/11/16  Yes Piscoya, Elita Quick, MD  allopurinol (ZYLOPRIM) 300 MG tablet Take 1 tablet by mouth daily. 02/27/16  Yes [provider]  amLODipine (NORVASC) 10 MG tablet Take 1 tablet by mouth daily. 02/27/16  Yes [provider]  cholecalciferol (VITAMIN D) 1000 units tablet Take 1,000 Units by mouth daily.    Yes [provider]  furosemide (LASIX) 40 MG tablet Take 40 mg by mouth daily.    Yes [provider]  metoprolol succinate (TOPROL-XL) 50 MG 24 hr tablet Take 50 mg by mouth daily. Take with or immediately following a meal.   Yes [provider]  ramipril (ALTACE) 10 MG capsule Take 10 mg by mouth 2 (two) times daily.  Yes [provider]  simvastatin (ZOCOR) 20 MG tablet Take 20 mg by mouth daily.   Yes [provider]  guaiFENesin-dextromethorphan (ROBITUSSIN DM) 100-10 MG/5ML syrup Take 5 mLs by mouth every 4 (four) hours as needed for cough. Patient not taking: Reported on 04/07/2017 02/14/17   Milagros Loll, MD  ondansetron (ZOFRAN ODT) 4 MG disintegrating tablet Take 1 tablet (4 mg total) by mouth every 8 (eight) hours as needed for  nausea or vomiting. Patient not taking: Reported on 04/05/2017 11/24/16   Merrily Brittle, MD    No Known Allergies  Family History  Problem Relation Age of Onset  . Hypertension Sister     Social History Social History   Tobacco Use  . Smoking status: Former Smoker    Last attempt to quit: 03/28/1964    Years since quitting: 53.0  . Smokeless tobacco: Never Used  Substance Use Topics  . Alcohol use: Yes    Comment: Occasional  . Drug use: No    Review of Systems  Level 5 caveat limited due to patient under critical illness  No reported recent fevers, cough, chest pain he was complaining of constipation but it does not sound like he is complaining of significant abdominal pain. ____________________________________________   PHYSICAL EXAM:  VITAL SIGNS: ED Triage Vitals  Enc Vitals Group     BP 04/04/2017 2250 (!) 134/54     Pulse Rate 03/15/2017 2248 65     Resp 03/21/2017 2248 19     Temp --      Temp src --      SpO2 03/29/2017 2248 100 %     Weight --      Height --      Head Circumference --      Peak Flow --      Pain Score --      Pain Loc --      Pain Edu? --      Excl. in GC? --      Constitutional: Unresponsive, active CPR. HEENT   Head: Normocephalic and atraumatic.      Eyes: Conjunctivae are normal. Pupils equal and round.       Ears:         Nose: No congestion/rhinnorhea.   Mouth/Throat: Mucous membranes are moist.   Neck: No stridor. Cardiovascular/Chest: Adequate pulse with CPR chest compressions. Respiratory: Rhonchorous some foaming at the mouth.  Bag-valve-mask being used to oxygenate. Gastrointestinal: Soft.  Distended with apparent midline hernia?  Large midline vertical scar. Genitourinary/rectal:Deferred Musculoskeletal: No evidence of trauma. Neurologic: Unresponsive. Skin:  Skin is warm, dry and intact. No rash noted.  ____________________________________________  LABS (pertinent positives/negatives) I, Governor Rooks,  MD the attending physician have reviewed the labs noted below.  Labs Reviewed  COMPREHENSIVE METABOLIC PANEL - Abnormal; Notable for the following components:      Result Value   Potassium 3.2 (*)    CO2 17 (*)    Glucose, Bld 407 (*)    BUN 24 (*)    Creatinine, Ser 2.51 (*)    Albumin 2.9 (*)    AST 53 (*)    Alkaline Phosphatase 221 (*)    GFR calc non Af Amer 21 (*)    GFR calc Af Amer 24 (*)    Anion gap 18 (*)    All other components within normal limits  TROPONIN I - Abnormal; Notable for the following components:   Troponin I 0.11 (*)    All other components within  normal limits  CBC WITH DIFFERENTIAL/PLATELET - Abnormal; Notable for the following components:   WBC 14.7 (*)    RBC 4.22 (*)    Hemoglobin 12.9 (*)    MCHC 31.0 (*)    RDW 16.1 (*)    Platelets 145 (*)    Neutro Abs 10.8 (*)    All other components within normal limits  PROTIME-INR - Abnormal; Notable for the following components:   Prothrombin Time 16.5 (*)    All other components within normal limits  URINE CULTURE  CULTURE, BLOOD (ROUTINE X 2)  CULTURE, BLOOD (ROUTINE X 2)  LIPASE, BLOOD  BRAIN NATRIURETIC PEPTIDE  URINALYSIS, COMPLETE (UACMP) WITH MICROSCOPIC  BLOOD GAS, ARTERIAL  LACTIC ACID, PLASMA  LACTIC ACID, PLASMA  TYPE AND SCREEN  TYPE AND SCREEN    ____________________________________________    EKG I, Governor Rooksebecca Danya Spearman, MD, the attending physician have personally viewed and interpreted all ECGs.  None ____________________________________________  RADIOLOGY All Xrays were viewed by me.  Imaging interpreted by Radiologist, and I, Governor Rooksebecca Sumer [place], MD the attending physician have reviewed the radiologist interpretation noted below.  CT head without contrast: Pending  Chest portable one view: Official read pending.  To my view ET tube adequate.  No pneumothorax.  No focal infiltrate.  Abdomen portable 1 view: She will read pending.  To my view no free air, NG tube in adequate  position. __________________________________________  PROCEDURES  Procedure(s) performed: INTUBATION Performed by: Governor Rooksebecca Tyishia Aune  Required items: required blood products, implants, devices, and special equipment available  Indications: Cardiac arrest  Intubation method: Glidescope Laryngoscopy   Preoxygenation: BVM  Tube Size: 7.5 cuffed  Post-procedure assessment: chest rise and ETCO2 monitor Breath sounds: equal and absent over the epigastrium Tube secured with: ETT holder Chest x-ray interpreted by radiologist and me.  Chest x-ray findings: endotracheal tube in appropriate position  Patient tolerated the procedure well with no immediate complications.     Critical Care performed: CRITICAL CARE Performed by: Governor Rooksebecca Jessee Mezera   Total critical care time: 70 minutes  Critical care time was exclusive of separately billable procedures and treating other patients.  Critical care was necessary to treat or prevent imminent or life-threatening deterioration.  Critical care was time spent personally by me on the following activities: development of treatment plan with patient and/or surrogate as well as nursing, discussions with consultants, evaluation of patient's response to treatment, examination of patient, obtaining history from patient or surrogate, ordering and performing treatments and interventions, ordering and review of laboratory studies, ordering and review of radiographic studies, pulse oximetry and re-evaluation of patient's condition.    ____________________________________________  ED COURSE / ASSESSMENT AND PLAN  Pertinent labs & imaging results that were available during my care of the patient were reviewed by me and considered in my medical decision making (see chart for details).   Patient arrived in CPR after witnessed arrest.  Unclear respiratory or cardiac, in fact immediately EMS was giving a report of possible hypovolemia due to nausea vomiting and  diarrhea.  Confirmed pulseless electrical activity and CPR was continued.  Preparations were made for intubation and patient was successfully intubated with glide scope.  CPR was continued with epi per protocol every 3 minutes.  2 L normal saline were started for volume resuscitation out of concern about hypovolemia state.  His abdomen seemed quite distended, and bedside ultrasound was used to fast and there is no evidence of free intra-abdominal fluid.  Cardiac ultrasound showed minimal activity/squeeze raising concern about longevity R likelihood  of patient being able to have return of spontaneous circulation  I did update the son and the daughter who are in the family room and did not know the patient had gone into cardiac arrest with EMS.  We discussed about the severity and they were understanding.  Somewhere around the sixth or seventh round of epi, patient did have return of spontaneous circulation with a strong femoral pulse, however heart rate was significantly bradycardic around 12.  Patient was external cardiac paced with capture and set at a rate of 66 with initial blood pressure of around 190 systolic.  Consultation was made with [facility] cardiology on-call who recommended tertiary care transfer.  I spoke with ICU attending at [facility] who did accept in transfer, and recommended against hypothermia protocol at this point time.  I spoke with the cardiology fellow on-call at [facility] who is available for consult when patient arrived in there are no additional immediate recommendations made for patient condition now.  Patient is currently on levo fed for a drop in blood pressure into the 60s-80s systolic.  Plans made for patient to do head CT.  Chest x-ray without focal infiltrate or pulmonary edema.  He did have some frothy edema coming from his vocal cord upon intubation.  Nurse did let me know 11:35 BP still low, will increase levophed.  Patient care transferred to Dr.  Lamont Snowballifenbark at shift change 11:30pm. Awaiting CT head, labs, and is prepared for transfer if he is stable. Family understands his condition is extremely tenuous.   DIFFERENTIAL DIAGNOSIS: Including but not limited to arrhythmia, ACS, congestive heart failure, sepsis, stroke, hemorrhage, hypokalemia, etc.  CONSULTATIONS:   Dr. Shirlee LatchMcLean, cardiology on-call for [place], discussed patient on external pacing after cardiac arrest and he recommends patient be transferred to tertiary care center at [facility].  I spoke with ICU attending Dr. Letitia Neriieterding at [facility] who accepts on behalf of Dr. Carolynn CommentKristin Scatliffe with the ICU, recommends against hypothermic protocol at this point time given duration of CPR greater than 30 minutes approximately as well as patient is making some movements now.  I spoke with on-call cardiology fellow Dr. Cristina GongVedre at [facility], no additional recommendations at this point, will anticipate call upon patient arrival to [facility] ICU as consulted.   Family informed of clinical course, medical decision-making process, and agree with plan.    ___________________________________________   FINAL CLINICAL IMPRESSION(S) / ED DIAGNOSES   Final diagnoses:  Cardiac arrest (HCC)      ___________________________________________        Note: This dictation was prepared with Dragon dictation. Any transcriptional errors that result from this process are unintentional    Governor RooksLord, Farrin Shadle, MD 12/26/2016 0006

## 2017-04-10 NOTE — ED Notes (Signed)
PT INTUBATED - 7.5 and 25 at lip

## 2017-04-10 NOTE — ED Notes (Signed)
EPI GIVEN BY IRIS, RN (3rd)

## 2017-04-10 NOTE — ED Notes (Signed)
EDP spoke with family in family waiting, they do not want to come back at this time

## 2017-04-10 NOTE — ED Notes (Signed)
PT BEING PACED AT 66bpm (capture noted) 

## 2017-04-10 NOTE — ED Notes (Signed)
2nd Liter bag hung

## 2017-04-10 NOTE — ED Notes (Addendum)
CPR initiated, pt placed on zoll monitor

## 2017-04-10 NOTE — ED Notes (Signed)
EPI GIVEN BY IRIS RN (6th)

## 2017-04-10 NOTE — ED Notes (Signed)
PULSE CHECK, PT ASYSTOLE

## 2017-04-10 NOTE — ED Notes (Addendum)
EPI GIVEN BY IRIS, RN (1st) - Per verbal by EDP give EPI q123mins

## 2017-04-10 NOTE — ED Notes (Signed)
1 LITER FLUIDS HUNG

## 2017-04-10 NOTE — ED Notes (Signed)
Pt moving arms, EDP order for medication due to pt intubated

## 2017-04-10 NOTE — ED Notes (Signed)
EPI GIVEN BY IRIS RN (5th)

## 2017-04-10 NOTE — ED Notes (Signed)
EPI GIVEN BY IRIS RN (4th)

## 2017-04-10 NOTE — ED Notes (Addendum)
Pace changed to 106 milliamps / HR 66bpm

## 2017-04-11 ENCOUNTER — Encounter: Payer: Self-pay | Admitting: Internal Medicine

## 2017-04-11 ENCOUNTER — Other Ambulatory Visit: Payer: Self-pay

## 2017-04-11 DIAGNOSIS — J9600 Acute respiratory failure, unspecified whether with hypoxia or hypercapnia: Secondary | ICD-10-CM | POA: Diagnosis present

## 2017-04-11 DIAGNOSIS — Z515 Encounter for palliative care: Secondary | ICD-10-CM | POA: Diagnosis present

## 2017-04-11 DIAGNOSIS — I2510 Atherosclerotic heart disease of native coronary artery without angina pectoris: Secondary | ICD-10-CM | POA: Diagnosis present

## 2017-04-11 DIAGNOSIS — G9340 Encephalopathy, unspecified: Secondary | ICD-10-CM | POA: Diagnosis present

## 2017-04-11 DIAGNOSIS — I469 Cardiac arrest, cause unspecified: Secondary | ICD-10-CM | POA: Diagnosis present

## 2017-04-11 DIAGNOSIS — K5900 Constipation, unspecified: Secondary | ICD-10-CM | POA: Diagnosis present

## 2017-04-11 DIAGNOSIS — Z87891 Personal history of nicotine dependence: Secondary | ICD-10-CM | POA: Diagnosis not present

## 2017-04-11 DIAGNOSIS — N179 Acute kidney failure, unspecified: Secondary | ICD-10-CM | POA: Diagnosis present

## 2017-04-11 DIAGNOSIS — Z8249 Family history of ischemic heart disease and other diseases of the circulatory system: Secondary | ICD-10-CM | POA: Diagnosis not present

## 2017-04-11 DIAGNOSIS — R570 Cardiogenic shock: Secondary | ICD-10-CM | POA: Diagnosis present

## 2017-04-11 DIAGNOSIS — I10 Essential (primary) hypertension: Secondary | ICD-10-CM | POA: Diagnosis present

## 2017-04-11 DIAGNOSIS — R4182 Altered mental status, unspecified: Secondary | ICD-10-CM | POA: Diagnosis present

## 2017-04-11 DIAGNOSIS — Z66 Do not resuscitate: Secondary | ICD-10-CM | POA: Diagnosis present

## 2017-04-11 DIAGNOSIS — E872 Acidosis: Secondary | ICD-10-CM | POA: Diagnosis present

## 2017-04-11 LAB — CBC WITH DIFFERENTIAL/PLATELET
Basophils Absolute: 0 10*3/uL (ref 0–0.1)
Basophils Relative: 0 %
Eosinophils Absolute: 0 10*3/uL (ref 0–0.7)
Eosinophils Relative: 0 %
HEMATOCRIT: 39.1 % — AB (ref 40.0–52.0)
Hemoglobin: 12.1 g/dL — ABNORMAL LOW (ref 13.0–18.0)
LYMPHS ABS: 1.3 10*3/uL (ref 1.0–3.6)
LYMPHS PCT: 17 %
MCH: 30.9 pg (ref 26.0–34.0)
MCHC: 31 g/dL — AB (ref 32.0–36.0)
MCV: 99.6 fL (ref 80.0–100.0)
MONO ABS: 0.4 10*3/uL (ref 0.2–1.0)
MONOS PCT: 5 %
NEUTROS ABS: 6 10*3/uL (ref 1.4–6.5)
Neutrophils Relative %: 78 %
Platelets: 119 10*3/uL — ABNORMAL LOW (ref 150–440)
RBC: 3.93 MIL/uL — ABNORMAL LOW (ref 4.40–5.90)
RDW: 16.2 % — AB (ref 11.5–14.5)
WBC: 7.7 10*3/uL (ref 3.8–10.6)

## 2017-04-11 LAB — URINALYSIS, COMPLETE (UACMP) WITH MICROSCOPIC: Specific Gravity, Urine: 1.018 (ref 1.005–1.030)

## 2017-04-11 LAB — BLOOD GAS, ARTERIAL
Bicarbonate: NOT DETECTED mmol/L (ref 20.0–28.0)
FIO2: 1
FIO2: 100
MECHANICAL RATE: 16
Mechanical Rate: 16
O2 SAT: NOT DETECTED %
PEEP/CPAP: 5 cmH2O
Patient temperature: 37
Patient temperature: 37
VT: 500 mL
VT: 500 mL
pCO2 arterial: 55 mmHg — ABNORMAL HIGH (ref 32.0–48.0)
pCO2 arterial: 59 mmHg — ABNORMAL HIGH (ref 32.0–48.0)
pO2, Arterial: 103 mmHg (ref 83.0–108.0)
pO2, Arterial: 111 mmHg — ABNORMAL HIGH (ref 83.0–108.0)

## 2017-04-11 LAB — TROPONIN I: TROPONIN I: 0.54 ng/mL — AB (ref <0.03)

## 2017-04-11 LAB — TYPE AND SCREEN
ABO/RH(D): O POS
Antibody Screen: NEGATIVE

## 2017-04-11 LAB — LACTIC ACID, PLASMA
LACTIC ACID, VENOUS: 9.8 mmol/L — AB (ref 0.5–1.9)
Lactic Acid, Venous: 11 mmol/L (ref 0.5–1.9)

## 2017-04-11 LAB — GLUCOSE, CAPILLARY: GLUCOSE-CAPILLARY: 156 mg/dL — AB (ref 65–99)

## 2017-04-11 MED ORDER — SODIUM CHLORIDE 0.9 % IV SOLN
0.0000 ug/min | INTRAVENOUS | Status: DC
  Administered 2017-04-11: 90 ug/min via INTRAVENOUS
  Administered 2017-04-11: 20 ug/min via INTRAVENOUS
  Filled 2017-04-11 (×3): qty 1
  Filled 2017-04-11: qty 10

## 2017-04-11 MED ORDER — FENTANYL CITRATE (PF) 100 MCG/2ML IJ SOLN
100.0000 ug | Freq: Once | INTRAMUSCULAR | Status: AC
  Administered 2017-04-11: 100 ug via INTRAVENOUS

## 2017-04-11 MED ORDER — FENTANYL CITRATE (PF) 100 MCG/2ML IJ SOLN
200.0000 ug | Freq: Once | INTRAMUSCULAR | Status: AC
  Administered 2017-04-11: 200 ug via INTRAVENOUS
  Filled 2017-04-11: qty 4

## 2017-04-11 MED ORDER — SODIUM CHLORIDE 0.9 % IV BOLUS (SEPSIS)
1000.0000 mL | Freq: Once | INTRAVENOUS | Status: AC
  Administered 2017-04-11: 1000 mL via INTRAVENOUS

## 2017-04-11 MED ORDER — ACETAMINOPHEN 650 MG RE SUPP: 650.0000 mg | Freq: Four times a day (QID) | RECTAL | Status: DC | PRN

## 2017-04-11 MED ORDER — DEXTROSE 5 % IV SOLN
2.0000 g | Freq: Once | INTRAVENOUS | Status: AC
  Administered 2017-04-11: 2 g via INTRAVENOUS
  Filled 2017-04-11: qty 2

## 2017-04-11 MED ORDER — SODIUM BICARBONATE 8.4 % IV SOLN
50.0000 meq | Freq: Once | INTRAVENOUS | Status: AC
  Administered 2017-04-11: 50 meq via INTRAVENOUS

## 2017-04-11 MED ORDER — FENTANYL CITRATE (PF) 100 MCG/2ML IJ SOLN
INTRAMUSCULAR | Status: AC
  Administered 2017-04-11: 100 ug via INTRAVENOUS
  Filled 2017-04-11: qty 2

## 2017-04-11 MED ORDER — FENTANYL CITRATE (PF) 100 MCG/2ML IJ SOLN
INTRAMUSCULAR | Status: AC
  Filled 2017-04-11: qty 2

## 2017-04-11 MED ORDER — SODIUM CHLORIDE 0.9 % IV SOLN
100.0000 ug/h | INTRAVENOUS | Status: DC
  Administered 2017-04-11: 100 ug/h via INTRAVENOUS
  Filled 2017-04-11: qty 50

## 2017-04-11 MED ORDER — DOPAMINE-DEXTROSE 3.2-5 MG/ML-% IV SOLN
2.0000 ug/kg/min | Freq: Once | INTRAVENOUS | Status: AC
  Administered 2017-04-11: 5 ug/kg/min via INTRAVENOUS

## 2017-04-11 MED ORDER — SODIUM CHLORIDE 0.9 % IV SOLN
0.0000 ug/min | INTRAVENOUS | Status: DC
  Administered 2017-04-11: 400 ug/min via INTRAVENOUS
  Filled 2017-04-11: qty 4

## 2017-04-11 MED ORDER — DEXTROSE 5 % IV SOLN
0.0000 ug/min | Freq: Once | INTRAVENOUS | Status: DC
  Filled 2017-04-11: qty 1

## 2017-04-11 MED ORDER — ACETAMINOPHEN 325 MG PO TABS: 650.0000 mg | ORAL_TABLET | Freq: Four times a day (QID) | ORAL | Status: DC | PRN

## 2017-04-11 MED ORDER — VASOPRESSIN 20 UNIT/ML IV SOLN
0.0300 [IU]/min | INTRAVENOUS | Status: DC
  Administered 2017-04-11: 0.03 [IU]/min via INTRAVENOUS
  Filled 2017-04-11 (×2): qty 2

## 2017-04-11 MED ORDER — DOPAMINE-DEXTROSE 1.6-5 MG/ML-% IV SOLN: 2.0000 ug/kg/min | Freq: Once | INTRAVENOUS | Status: DC

## 2017-04-11 MED ORDER — SODIUM BICARBONATE 8.4 % IV SOLN
50.0000 meq | Freq: Once | INTRAVENOUS | Status: AC
  Administered 2017-04-11: 50 meq via INTRAVENOUS
  Filled 2017-04-11: qty 50

## 2017-04-11 MED ORDER — DEXTROSE 5 % IV SOLN
0.0000 ug/min | Freq: Once | INTRAVENOUS | Status: AC
  Administered 2017-04-11: 40 ug/min via INTRAVENOUS
  Filled 2017-04-11: qty 16

## 2017-04-11 MED FILL — Medication: Qty: 1 | Status: AC

## 2017-04-12 LAB — URINE CULTURE: CULTURE: NO GROWTH

## 2017-04-13 ENCOUNTER — Telehealth: Payer: Self-pay

## 2017-04-13 NOTE — Telephone Encounter (Signed)
Received death certificate   Placed in nurse box

## 2017-04-14 NOTE — ED Notes (Signed)
Admitting MD at bedside.

## 2017-04-14 NOTE — ED Notes (Signed)
ED Provider at bedside. 

## 2017-04-14 NOTE — Progress Notes (Signed)
Chaplain received a page to see a pt in ED06 whom the on-call chaplain had visited earlier per chaplain notes. [place] met pt family (daugher, son and others family members) at bedside. Admitting Dr. Informed the family that pt was very sick to be transfer to other hospitals at this point, and that pt would be admitted in ICU for further observation. Doctor also informed the family that pt's prognosis was very poor at this moment. Family received the news but said they were waiting for other family members to arrive. Arthur provided the emotional support and ministry of presence. CH is available to follow up as needed.    Apr 13, 2017 0900  Clinical Encounter Type  Visited With Patient;Patient and family together;Health care provider  Visit Type Initial;Follow-up;Code;Critical Care;ED  Referral From Nurse  Consult/Referral To Chaplain  Spiritual Encounters  Spiritual Needs Emotional;Other (Comment)  Stress Factors  Patient Stress Factors Health changes  Family Stress Factors Health changes

## 2017-04-14 NOTE — Progress Notes (Signed)
After reviewing patient's chart patient is a 84 year old African-American male who underwent cardiac arrest 15 hours ago patient was at home with family when he developed acute abdominal pain with nausea and vomiting and constipation patient then went unresponsive,  EMS was called patient arrived to the emergency room and was intubated and placed on mechanical ventilation and CPR was performed for prolonged period of time.  Upon arrival to the intensive care unit the patient was made a DO NOT RESUSCITATE however upon further assessment of the patient in the ICU,  I examined the patient-patient does not have a gag reflex no corneal reflex there is no signs of brainstem reflexes, patient does not have a pulse and upon further evaluation I placed a ultrasound probe in the cardiac region and there was no cardiac pulsations seen under ultrasound. after further assessment I do believe patient arrived to the intensive care unit having already died.  I met with multiple family members including the daughters and the son and the wife and I have explained to them that the patient is no longer viable and he has already passed and the plan will be to remove the ventilator and the external pacer and stop all medicines and have the family to be at bedside  The family understands and agrees with the plan of care will proceed with comfort care measures by removing  ventilatory supports and medications.    Corrin Parker, M.D.  [facility]  Medical Director Post Director [facility] Cardio-Pulmonary Department

## 2017-04-14 NOTE — ED Notes (Signed)
Bair hugger applied at this time at 43 degree Celsius.

## 2017-04-14 NOTE — ED Notes (Signed)
Per family in rm, states pt was at home and became lethargic, did not respond to questions and EMS was called.

## 2017-04-14 NOTE — ED Notes (Signed)
Charge nurse at bedside talking to family.

## 2017-04-14 NOTE — ED Notes (Signed)
Chaplain at bedside

## 2017-04-14 NOTE — ED Notes (Addendum)
Pt's oxygen sat continues to drop, MD at bedside, vent checked and connections checked. Unable to obtain blood pressure. Both daughters at bedside. MD requested pause on pacer, 4:1 button initiated on zole. Pulse 20 and palpable.

## 2017-04-14 NOTE — Progress Notes (Signed)
Chaplain paged for "End of Life" of a pt in [place] whom chaplain had seen earlier in the ED. Pt was comfort care but was either expiring or had expired. ICU doctor was consulting with the family (2 daughters and 2 brothers) at the time [facility] arrived. Doctor informed the family pt is dead. Family was emotional and tearful. [place] provided grief support and prayers to family to cope with the loss. [place] stayed with family for a while and then met with the pt.'s youngest daughter and son separately in the visitors RM to provide comfort and emotional support to them.  CH is available to follow up with the family as needed.    2017-04-15 1200  Clinical Encounter Type  Visited With Patient;Patient and family together;Health care provider  Visit Type Follow-up;Spiritual support;Death;Patient actively dying  Referral From Nurse  Consult/Referral To Chaplain  Spiritual Encounters  Spiritual Needs Grief support

## 2017-04-14 NOTE — Progress Notes (Signed)
   03/24/2017 2230  Clinical Encounter Type  Visited With Family  Visit Type Initial;Code;Critical Care;ED  Referral From Nurse  Consult/Referral To Chaplain  Spiritual Encounters  Spiritual Needs Prayer;Emotional  Stress Factors  Family Stress Factors Health changes   Initial visit for spiritual/emotional support as referred by nurse.  Chaplain met with family and physician in consult room.  Family (pts daughter and son) received news from physician that medical team was doing CPR.  Family did not display tears and instead asked for prayer.  Pts son wanted to make sure he did everything he could to help pt.  Chaplain facilitated life review.  Daughter shared that pt enjoyed hunting, fishing, working on cars, and all things history.  She noted that he was a very smart man.    Chaplain facilitated communication between family and medical team.    Chaplain remains available for continued spiritual/emotional support.  Rev. [place]

## 2017-04-14 NOTE — H&P (Signed)
[facility] [place] [facility]   PATIENT NAME: Louis Lawson    MR#:  [id number]  DATE OF BIRTH:  Aug 27, 1922  DATE OF ADMISSION:  03/28/2017  PRIMARY CARE PHYSICIAN: [facility]   REQUESTING/REFERRING PHYSICIAN: Dr Jene Every  CHIEF COMPLAINT:   Chief Complaint  Patient presents with  . Unresponsive    HISTORY OF PRESENT ILLNESS:  Louis Lawson  is a 84 y.o. male came in with a cardiac arrest last evening.  Patient was initially seen yesterday by Dr. Shaune Pollack ER physician, overnight by Dr. Lamont Snowball ER physician and this morning by Dr. Cyril Loosen ER physician.  Patient now was thought to be too unstable to transfer to a tertiary care center.  I was asked to admit for comfort care measures but family has not decided on CODE STATUS for me.  Patient unable to give history.  History obtained from old chart and family at the bedside.  Family states that he was responsive up until 3 AM when they gave him sedative medication and then since then has not been responsive.  The transfer to [facility] was going to happen this morning but now too unstable to transfer.  Hospitalist services were contacted for further evaluation.  The patient is currently on 4 pressors and blood pressure is in the 50s systolic.  The patient is unresponsive.  Right pupil fixed and dilated.  PAST MEDICAL HISTORY:   Past Medical History:  Diagnosis Date  . Coronary artery disease   . Hypertension   . Irregular heart beat     PAST SURGICAL HISTORY:   Past Surgical History:  Procedure Laterality Date  . APPENDECTOMY N/A 11/04/2016   Procedure: APPENDECTOMY;  Surgeon: Leafy Ro, MD;  Location: [facility] ORS;  Service: General;  Laterality: N/A;  . LAPAROTOMY N/A 11/04/2016   Procedure: EXPLORATORY LAPAROTOMY;  Surgeon: Leafy Ro, MD;  Location: [facility] ORS;  Service: General;  Laterality: N/A;  . UMBILICAL HERNIA REPAIR N/A 11/04/2016   Procedure: HERNIA REPAIR UMBILICAL ADULT;   Surgeon: Leafy Ro, MD;  Location: [facility] ORS;  Service: General;  Laterality: N/A;  . VASCULAR SURGERY Right    Lower Extremity- secondary to DVT    SOCIAL HISTORY:   Social History   Tobacco Use  . Smoking status: Former Smoker    Last attempt to quit: 03/28/1964    Years since quitting: 53.0  . Smokeless tobacco: Never Used  Substance Use Topics  . Alcohol use: Yes    Comment: Occasional    FAMILY HISTORY:   Family History  Problem Relation Age of Onset  . Hypertension Sister   . Cancer Father     DRUG ALLERGIES:  No Known Allergies  REVIEW OF SYSTEMS:  Review of systems unable to be performed secondary to unresponsiveness  MEDICATIONS AT HOME:   Prior to Admission medications   Medication Sig Start Date End Date Taking? Authorizing Provider  acetaminophen (TYLENOL) 325 MG tablet Take 2 tablets (650 mg total) by mouth every 4 (four) hours as needed for mild pain (temp > 101.5). 11/11/16  Yes Piscoya, Elita Quick, MD  allopurinol (ZYLOPRIM) 300 MG tablet Take 1 tablet by mouth daily. 02/27/16  Yes [provider]  amLODipine (NORVASC) 10 MG tablet Take 1 tablet by mouth daily. 02/27/16  Yes [provider]  cholecalciferol (VITAMIN D) 1000 units tablet Take 1,000 Units by mouth daily.    Yes [provider]  furosemide (LASIX) 40 MG tablet Take 40 mg by  mouth daily.    Yes [provider]  metoprolol succinate (TOPROL-XL) 50 MG 24 hr tablet Take 50 mg by mouth daily. Take with or immediately following a meal.   Yes [provider]  ramipril (ALTACE) 10 MG capsule Take 10 mg by mouth 2 (two) times daily.    Yes [provider]  simvastatin (ZOCOR) 20 MG tablet Take 20 mg by mouth daily.   Yes [provider]  guaiFENesin-dextromethorphan (ROBITUSSIN DM) 100-10 MG/5ML syrup Take 5 mLs by mouth every 4 (four) hours as needed for cough. Patient not taking: Reported on 2016-07-29 02/14/17   Milagros LollSudini, Srikar, MD   ondansetron (ZOFRAN ODT) 4 MG disintegrating tablet Take 1 tablet (4 mg total) by mouth every 8 (eight) hours as needed for nausea or vomiting. Patient not taking: Reported on 2016-07-29 11/24/16   Merrily Brittleifenbark, Neil, MD      VITAL SIGNS:  Blood pressure (!) 87/75, pulse 64, temperature (!) 93.3 F (34.1 C), temperature source Rectal, resp. rate 19, height 5\' 10"  (1.778 m), weight 99.8 kg (220 lb), SpO2 99 %.  PHYSICAL EXAMINATION:  GENERAL:  84 y.o.-year-old patient lying in the bed unresponsive on ventilator EYES: Right pupil fixed and dilated, left pupil dilated but not as much of the right. HEENT: Unable to look into the mouth NECK:  Supple, no jugular venous distention. No thyroid enlargement, no tenderness.  LUNGS: Decreased breath sounds bilaterally, positive wheezing, no rales,rhonchi or crepitation. No use of accessory muscles of respiration.  CARDIOVASCULAR: S1, S2 normal. No murmurs, rubs, or gallops.  ABDOMEN: Soft, nontender, nondistended. Bowel sounds present. No organomegaly or mass.  EXTREMITIES: 3+ edema, no cyanosis, or clubbing.  NEUROLOGIC: Patient unresponsive to sternal rub.  I asked him to squeeze with his right hand and I thought he was able to do that slightly. PSYCHIATRIC: The patient unresponsive.  SKIN: Chronic lower extremity discoloration  LABORATORY PANEL:   CBC Recent Labs  Lab 03/16/2017 0539  WBC 7.7  HGB 12.1*  HCT 39.1*  PLT 119*   ------------------------------------------------------------------------------------------------------------------  Chemistries  Recent Labs  Lab Nov 13, 2016 2249  NA 138  K 3.2*  CL 103  CO2 17*  GLUCOSE 407*  BUN 24*  CREATININE 2.51*  CALCIUM 9.0  AST 53*  ALT 17  ALKPHOS 221*  BILITOT 0.8   ------------------------------------------------------------------------------------------------------------------  Cardiac Enzymes Recent Labs  Lab Nov 13, 2016 2249  TROPONINI 0.11*    ------------------------------------------------------------------------------------------------------------------  RADIOLOGY:  Ct Head Wo Contrast  Result Date: 03/28/2017 CLINICAL DATA:  84 year old male with unresponsiveness. EXAM: CT HEAD WITHOUT CONTRAST TECHNIQUE: Contiguous axial images were obtained from the base of the skull through the vertex without intravenous contrast. COMPARISON:  Brain MRI dated 12/09/2004 FINDINGS: Evaluation of this exam is limited due to motion artifact. Brain: There is mild age-related atrophy and chronic microvascular ischemic changes. There is a suprasellar mass measuring 2.7 x 2.1 cm in axial dimensions and 1.8 cm in craniocaudal length (measuring approximately 1.7 x 1.6 cm in axial dimensions on the CT of 07/17/2004) consistent with known pituitary macroadenoma. There is no acute intracranial hemorrhage. No midline shift. No extra-axial fluid collection. Vascular: No hyperdense vessel or unexpected calcification. Skull: Normal. Negative for fracture or focal lesion. Sinuses/Orbits: No acute finding. Other: None IMPRESSION: 1. No acute intracranial hemorrhage. 2. Age-related atrophy and chronic microvascular ischemic changes. 3. Interval increase in the size of known pituitary microadenoma. Electronically Signed   By: Elgie CollardArash  Radparvar M.D.   On: 04/05/2017 00:39   Dg Chest Portable 1  View  Result Date: 2017/03/18 CLINICAL DATA:  Post intubation EXAM: PORTABLE CHEST 1 VIEW COMPARISON:  02/12/2017 FINDINGS: Endotracheal tube tip is about 3 cm superior to the carina. Esophageal tube extends below diaphragm but is non included. Low lung volumes. Cardiomegaly. Aortic atherosclerosis. No pneumothorax. IMPRESSION: 1. Endotracheal tube tip is about 3 cm superior to the carina 2. Stable mild cardiomegaly 3. Hypoventilatory changes with atelectasis or scar at the left base Electronically Signed   By: Jasmine PangKim  Fujinaga M.D.   On: 2017/03/18 23:47   Dg Abd Portable 1  View  Result Date: 2017/03/18 CLINICAL DATA:  OG tube placement EXAM: PORTABLE ABDOMEN - 1 VIEW COMPARISON:  02/12/2017 FINDINGS: Distal portion of the esophageal tube is obscured by overlying leads and support devices. Suspect that the tip and side-port overlie the mid stomach. IMPRESSION: Difficult visualization of the esophageal tube tip, suspect that it overlies the mid stomach. Electronically Signed   By: Jasmine PangKim  Fujinaga M.D.   On: 2017/03/18 23:49    EKG:   Not seen with his chart or in the computer so I ordered 1  IMPRESSION AND PLAN:   1.  Cardiac arrest with multiorgan failure.  Overall prognosis is poor.  Family still deciding on CODE STATUS.  Case discussed with critical care specialist. 2.  Shock on 4 pressors and blood pressure systolic in the 50s. 3.  Bradycardia on external pacer 4.  Acute encephalopathy with right pupil fixed and dilated.  Patient too unstable for repeat CAT scan. 5.  Acute respiratory failure on ventilator.  Case discussed with critical care specialist to manage. 6.  Severe prolonged acidosis with pH less than 6.94 for over 7 hours.  Overall prognosis with this is pretty poor. 7.  Acute kidney injury.  Secondary to cardiac arrest 8.  Lactic acidosis secondary to cardiac arrest   All the records are reviewed and case discussed with ED provider. Management plans discussed with the patient, family and they are in agreement.  CODE STATUS: At this point still full code  TOTAL TIME TAKING CARE OF THIS PATIENT: 60 minutes.  Critical care time.  Alford Highlandichard Kaiyla Stahly M.D on 03/26/2017 at 9:20 AM  Between 7am to 6pm - Pager - [phone]  After 6pm call admission pager [phone]  [facility] [employer] [facility]  [phone]  CC: Primary care physician; [facility]

## 2017-04-14 NOTE — ED Notes (Signed)
Dr. Elvera Lennox at bedside talking to family

## 2017-04-14 NOTE — Progress Notes (Signed)
Assisted with patient transport to ICU from ER while patient was on Trilogy ventilator.

## 2017-04-14 NOTE — Death Summary Note (Signed)
DEATH SUMMARY   Patient Details  Name: Louis Lawson MRN: [id number] DOB: [birth year]  Admission/Discharge Information   Admit Date:  03/31/2017  Date of Death: Date of Death: 03/31/2017  Time of Death: Time of Death: 1035  Length of Stay: 0  Referring Physician: [facility]   Reason(s) for Hospitalization  Cardiac arrest  Diagnoses  Preliminary cause of death:  Secondary Diagnoses (including complications and co-morbidities):  Active Problems:   Cardiac arrest [facility](HCC)   Brief Hospital Course (including significant findings, care, treatment, and services provided and events leading to death)  Louis Lawson is a 84 y.o. year old male who had abd pain with N/V With acute and Sudden cardiac arrest, ALCS and CPR started with EMS and ER, patient made DNR in ER  Patient was DOA upon admission to ICU Family updated, comfort measures placed.      Pertinent Labs and Studies  Significant Diagnostic Studies Ct Head Wo Contrast  Result Date: 03/29/2017 CLINICAL DATA:  84 year old male with unresponsiveness. EXAM: CT HEAD WITHOUT CONTRAST TECHNIQUE: Contiguous axial images were obtained from the base of the skull through the vertex without intravenous contrast. COMPARISON:  Brain MRI dated 12/09/2004 FINDINGS: Evaluation of this exam is limited due to motion artifact. Brain: There is mild age-related atrophy and chronic microvascular ischemic changes. There is a suprasellar mass measuring 2.7 x 2.1 cm in axial dimensions and 1.8 cm in craniocaudal length (measuring approximately 1.7 x 1.6 cm in axial dimensions on the CT of 07/17/2004) consistent with known pituitary macroadenoma. There is no acute intracranial hemorrhage. No midline shift. No extra-axial fluid collection. Vascular: No hyperdense vessel or unexpected calcification. Skull: Normal. Negative for fracture or focal lesion. Sinuses/Orbits: No acute finding. Other: None IMPRESSION: 1. No acute intracranial hemorrhage. 2.  Age-related atrophy and chronic microvascular ischemic changes. 3. Interval increase in the size of known pituitary microadenoma. Electronically Signed   By: Elgie CollardArash  Radparvar M.D.   On: 03/15/2017 00:39   Dg Chest Portable 1 View  Result Date: 03/20/2017 CLINICAL DATA:  Post intubation EXAM: PORTABLE CHEST 1 VIEW COMPARISON:  02/12/2017 FINDINGS: Endotracheal tube tip is about 3 cm superior to the carina. Esophageal tube extends below diaphragm but is non included. Low lung volumes. Cardiomegaly. Aortic atherosclerosis. No pneumothorax. IMPRESSION: 1. Endotracheal tube tip is about 3 cm superior to the carina 2. Stable mild cardiomegaly 3. Hypoventilatory changes with atelectasis or scar at the left base Electronically Signed   By: Jasmine PangKim  Fujinaga M.D.   On: 04/04/2017 23:47   Dg Abd Portable 1 View  Result Date: 04/02/2017 CLINICAL DATA:  OG tube placement EXAM: PORTABLE ABDOMEN - 1 VIEW COMPARISON:  02/12/2017 FINDINGS: Distal portion of the esophageal tube is obscured by overlying leads and support devices. Suspect that the tip and side-port overlie the mid stomach. IMPRESSION: Difficult visualization of the esophageal tube tip, suspect that it overlies the mid stomach. Electronically Signed   By: Jasmine PangKim  Fujinaga M.D.   On: 03/22/2017 23:49    Microbiology Recent Results (from the past 240 hour(s))  Culture, blood (routine x 2)     Status: None (Preliminary result)   Collection Time: 03/19/2017 12:56 AM  Result Value Ref Range Status   Specimen Description BLOOD RIGHT HAND  Final   Special Requests   Final    BOTTLES DRAWN AEROBIC AND ANAEROBIC Blood Culture adequate volume   Culture   Final    NO GROWTH < 12 HOURS Performed at [facility], [address] [place], [place] [address]    Report Status PENDING  Incomplete  Culture, blood (routine x 2)     Status: None (Preliminary result)   Collection Time: 03/27/2017 12:58 AM  Result Value Ref Range Status   Specimen Description  BLOOD RIGHT ARM  Final   Special Requests   Final    BOTTLES DRAWN AEROBIC AND ANAEROBIC Blood Culture adequate volume   Culture   Final    NO GROWTH < 12 HOURS Performed at [facility], [address] [place], [place] [address]    Report Status PENDING  Incomplete    Lab Basic Metabolic Panel: Recent Labs  Lab 06-02-16 2249  NA 138  K 3.2*  CL 103  CO2 17*  GLUCOSE 407*  BUN 24*  CREATININE 2.51*  CALCIUM 9.0   Liver Function Tests: Recent Labs  Lab 06-02-16 2249  AST 53*  ALT 17  ALKPHOS 221*  BILITOT 0.8  PROT 6.5  ALBUMIN 2.9*   Recent Labs  Lab 06-02-16 2249  LIPASE 51   No results for input(s): AMMONIA in the last 168 hours. CBC: Recent Labs  Lab 06-02-16 2249 04/06/2017 0539  WBC 14.7* 7.7  NEUTROABS 10.8* 6.0  HGB 12.9* 12.1*  HCT 41.4 39.1*  MCV 98.3 99.6  PLT 145* 119*   Cardiac Enzymes: Recent Labs  Lab 06-02-16 2249 04/01/2017 0549  TROPONINI 0.11* 0.54*   Sepsis Labs: Recent Labs  Lab 06-02-16 2249 06-02-16 2330 03/16/2017 0209 03/16/2017 0539  WBC 14.7*  --   --  7.7  LATICACIDVEN  --  11.0* 9.8*  --        [name] 04/08/2017, 1:46 PM

## 2017-04-14 NOTE — ED Notes (Signed)
New bag of neo-synephrine hung, same rate continued.

## 2017-04-14 NOTE — ED Notes (Signed)
Per family (pt's daughter and son) in rm talking with EDP, pt is full code.

## 2017-04-14 NOTE — Progress Notes (Signed)
Pt unable to tolerate titration of FiO2. Pt returned to 100%

## 2017-04-14 NOTE — ED Notes (Signed)
[facility], see new orders

## 2017-04-14 NOTE — ED Notes (Signed)
Pt moving arms up to intubation tube, new order see MAR, no other orders at this time for agitation

## 2017-04-14 NOTE — ED Notes (Signed)
MD at bedside discussing care with patient's daughter.

## 2017-04-14 NOTE — ED Notes (Signed)
New levophed bag changed, same rate as other administration continued.

## 2017-04-14 NOTE — ED Notes (Addendum)
Daughter in agreeance that patient will be comfort care, no resuscitation efforts but will continue medications, vent and pacer until the rest of family arrives.

## 2017-04-14 NOTE — ED Notes (Signed)
Pt moving hands at this time, reaching for intubation tube, see MAR for follow up

## 2017-04-14 NOTE — ED Notes (Signed)
MD aware vasopressors are all maxed out, no new orders at this time. Pt's BP 48/35.

## 2017-04-14 NOTE — ED Notes (Signed)
Patient transported to CT 

## 2017-04-14 NOTE — ED Notes (Signed)
Bair Hugger still applied at 43 degrees celsius.

## 2017-04-14 NOTE — ED Notes (Signed)
Kailey from [facility] called back, states pt needs another ABG and for BP to be stabilized, charge RN notified and is calling Clinton SawyerKailey back for verification due to emergency transfer.

## 2017-04-14 NOTE — ED Notes (Signed)
Per Dr. Lamont Snowballifenbark, no bicarb adm at this time

## 2017-04-14 NOTE — ED Notes (Signed)
Pt repositioned, odor noted, pt given peri care. Small amount of bloody stool noted, EDP notified, will get CBC completed for hgb.

## 2017-04-14 NOTE — ED Notes (Signed)
Pt placed on bear hugger for temp, EDP aware

## 2017-04-14 NOTE — ED Notes (Signed)
Report given to Clinton SawyerKailey with [facility] by this RN

## 2017-04-14 NOTE — Progress Notes (Signed)
Patient ID: Louis Lawson, male   DOB: Jun 26, 1922, 84 y.o.   MRN: [id number]  spoke with wife and made a DNR after explaining poor prognosis.  Dr Renae GlossWieting

## 2017-04-14 NOTE — Progress Notes (Signed)
Pt arrived from ED on vent, max pressors and external pacemaker. Initially a very faint carotid was found but disappeared. US of  Heart with no wall or valve movement. Dr Belia HemanKasa spoke with family at length. Grieving at bedside. Chaplain here.

## 2017-04-14 NOTE — ED Notes (Addendum)
Dr Cyril LoosenKinner at bedside with daughter, [facility] and transport services won't transport patient due to critical status. MD explaining terminal status of patient to daughter, daughter insistent on transfer and continuing critical care. Daughter requesting patient have pacemaker inserted and patient continued to be transferred.

## 2017-04-14 NOTE — ED Notes (Signed)
Daughter at bedside.

## 2017-04-14 NOTE — ED Provider Notes (Signed)
[facility] will not [facility] because patient too unstable. Discussed this with daughter and at this point she has agreed to comfort care.   Jene EveryKinner, Quin Mcpherson, MD 03/11/17 [phone]

## 2017-04-14 NOTE — ED Notes (Signed)
Spoke with EDP, no more fluid bolus at this time, pt has received 4 Liters of fluids with no urine production, EDP aware. Will continue to monitor.

## 2017-04-14 NOTE — ED Notes (Signed)
New bag of levophed hung, same rate continued.

## 2017-04-14 NOTE — ED Provider Notes (Addendum)
Care signed over from Dr. Shaune PollackLord pending transfer to [facility].  Briefly the patient is a [age] year old man who came to the emergency department after witnessed cardiac arrest.  He was in pulseless electrical activity here in the emergency department and received 6 rounds of epinephrine prior to return of spontaneous circulation.  He is intubated requiring sedation with purposeful movement.  Unclear etiology of his PEA arrest.  Lab work does show acute kidney injury although no electrolyte abnormality which would explain his cardiac arrest.  Head CT is negative.  At this point the patient's blood pressure is 50/30 with levo fed at 60 mics.  Of added Neo-Synephrine which has not had any sort of effect.  Differential for PEA arrest includes septic shock as well as pulmonary embolism.  At this point the patient is being paced with critically low blood pressure and I think he is too unstable to go to the CT scanner.  I will add 2 g of ceftriaxone now as well as adding on vasopressin.   Merrily Brittleifenbark, Ott Zimmerle, MD 2016-05-04 0109   ----------------------------------------- 3:08 AM on 11/02/2016 -----------------------------------------  [facility].  [facility] is asked [facility].  ----------------------------------------- 3:41 AM on 11/02/2016 -----------------------------------------  I discussed the case with Dr. Allena EaringBazemore cardiology fellow [facility] the patient as a transfer directly to the Cath Lab.  Attending physician Dr. Myrtis SerKatz.  Family has been updated.  CRITICAL CARE Performed by: Merrily BrittleNeil Conrado Nance   Total critical care time: 40 minutes  Critical care time was exclusive of separately billable procedures and treating other patients.  Critical care was necessary to treat or prevent imminent or life-threatening deterioration.  Critical care was time spent personally by me on  the following activities: development of treatment plan with patient and/or surrogate as well as nursing, discussions with consultants, evaluation of patient's response to treatment, examination of patient, obtaining history from patient or surrogate, ordering and performing treatments and interventions, ordering and review of laboratory studies, ordering and review of radiographic studies, pulse oximetry and re-evaluation of patient's condition.    Merrily Brittleifenbark, Kimbra Marcelino, MD 2016-05-04 0341   ----------------------------------------- 5:38 AM on 11/02/2016 -----------------------------------------  I was notified by nursing that the patient has some bloody stools.  On exam it is dark red and not hematochezia not melena.  We will check a CBC now but I doubt that his hypotension is secondary to exsanguination.   Merrily Brittleifenbark, Adynn Caseres, MD 2016-05-04 0539   ----------------------------------------- 6:19 AM on 11/02/2016 -----------------------------------------  [facility] the case [facility] feels the patient is unstable for transport at this time and he will not authorize an ambulance.  He is requesting a dopamine infusion, sodium bicarbonate, as well as a new ABG.   Merrily Brittleifenbark, Gisel Vipond, MD 2016-05-04 [phone]   ----------------------------------------- 7:42 AM on 11/02/2016 -----------------------------------------  I discussed with Dr. Mariah MillingGollan of cardiology who indicated the patient could theoretically have an internal pacemaker placed although it would have to happen in the Cath Lab with the STEMI team.  I then had a lengthy discussion with the patient's daughter at bedside regarding his protracted hypotension and poor prognosis.  The patient is now off all sedation and he is no longer responsive.  He is now on 4 vasopressors with a blood pressure of 53/44.  [facility] taking report now for transfer.  Merrily Brittleifenbark, Maveryck Bahri, MD 04/02/2017 [phone]

## 2017-04-14 NOTE — ED Notes (Signed)
Pt's daughter Eunice BlaseDebbie signed transfer consent.

## 2017-04-14 NOTE — ED Notes (Signed)
MD requested pause on pacer, 4:1 button initiated on zole. Pulse 20 and palpable.

## 2017-04-14 DEATH — deceased

## 2017-04-15 NOTE — Telephone Encounter (Signed)
Death cert placed in DK's folder to be signed. 

## 2017-04-16 LAB — CULTURE, BLOOD (ROUTINE X 2)
CULTURE: NO GROWTH
Culture: NO GROWTH
Special Requests: ADEQUATE
Special Requests: ADEQUATE

## 2017-04-17 NOTE — Telephone Encounter (Signed)
[facility] informed death cert is ready for pick up.

## 2018-12-14 IMAGING — CT CT RENAL STONE PROTOCOL
2 of 4 series · 15 of 46 positions shown, 17 images · non-contrast
Comparison: CT of the abdomen pelvis dated 11/24/2016

CLINICAL DATA: [AGE] male with flank pain.

EXAM:
CT ABDOMEN AND PELVIS WITHOUT CONTRAST
TECHNIQUE: Multidetector CT imaging of the abdomen and pelvis was performed
following the standard protocol without IV contrast.

[Series 2: stone full standard · axial · 0.94mm/px · z∈[-473,-3]mm · 12 of 104 slices shown, 14 images]
[im 5/104  soft-tissue]
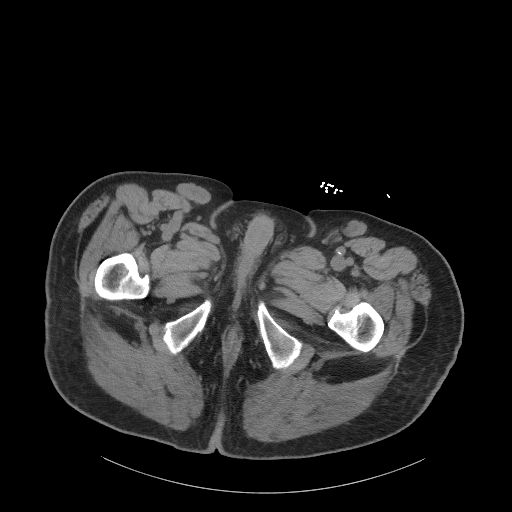
[im 5/104  bone]
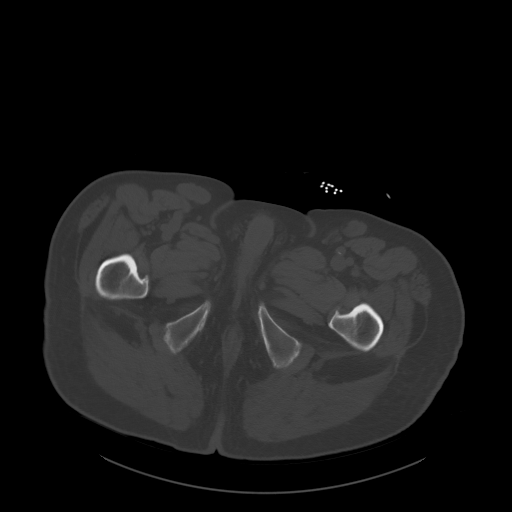
[im 14/104  soft-tissue]
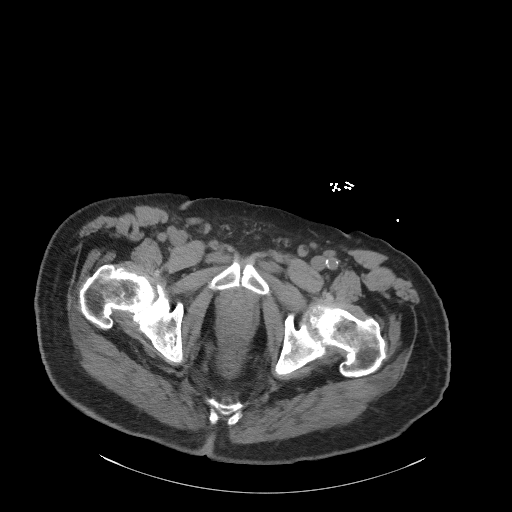
[im 23/104  soft-tissue]
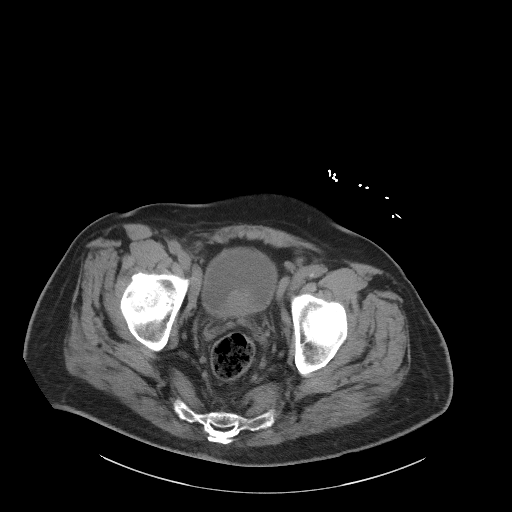
[im 32/104  soft-tissue]
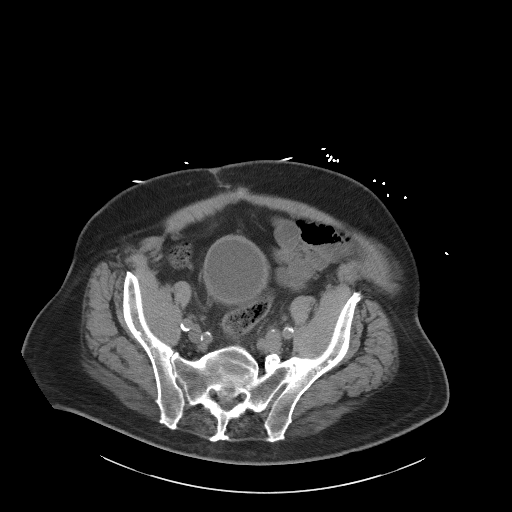
[im 41/104  soft-tissue]
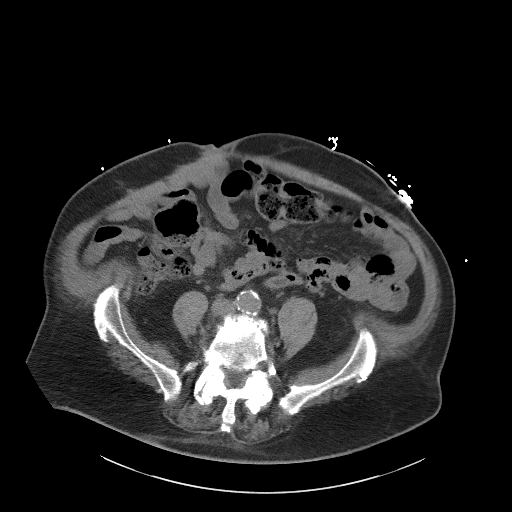
[im 50/104  soft-tissue]
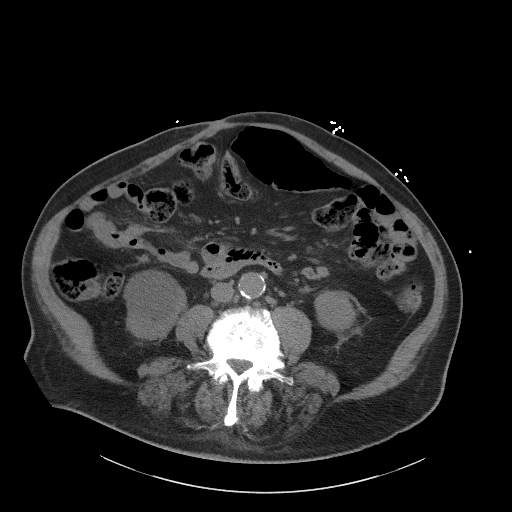
[im 54/104  soft-tissue]
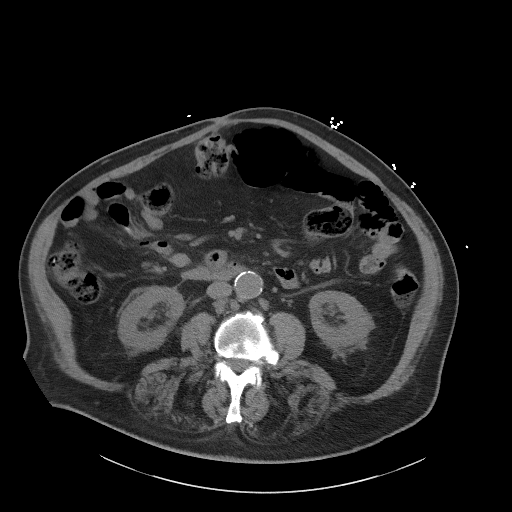
[im 63/104  soft-tissue]
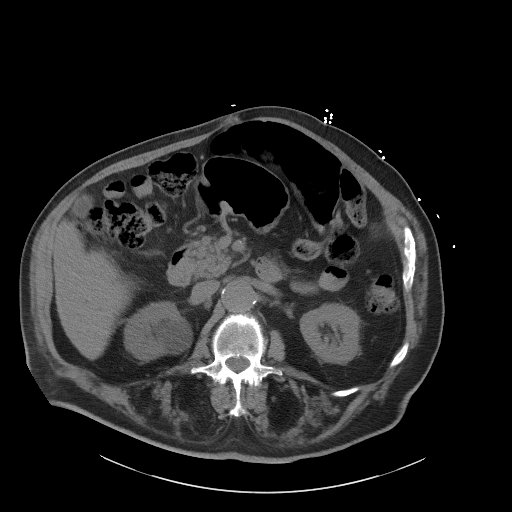
[im 72/104  soft-tissue]
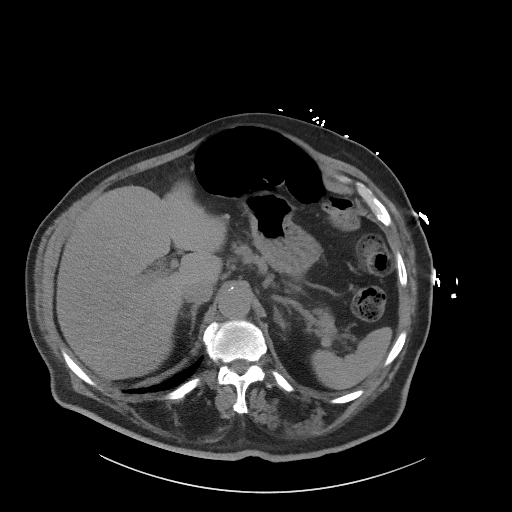
[im 72/104  bone]
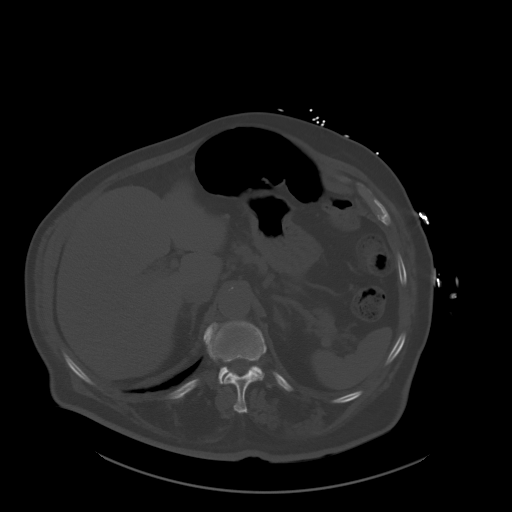
[im 81/104  soft-tissue]
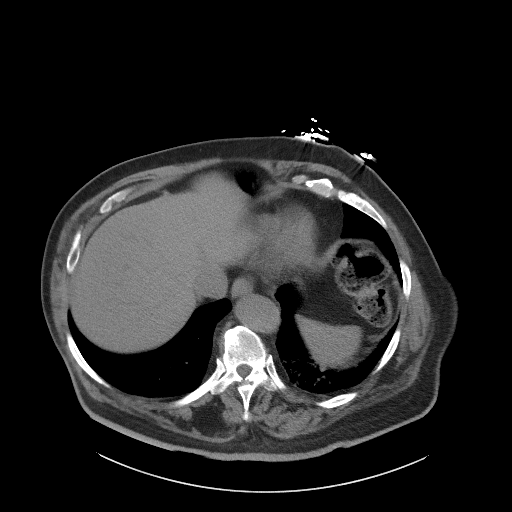
[im 90/104  soft-tissue]
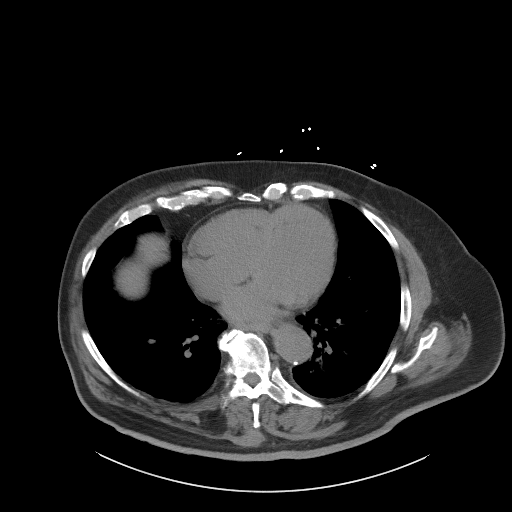
[im 99/104  soft-tissue]
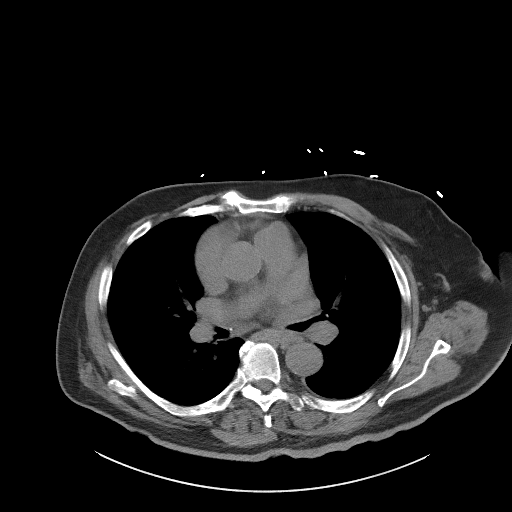

[Series 5: coronal · coronal · 0.87mm/px · 3 of 158 slices shown]
[im 53/158  soft-tissue]
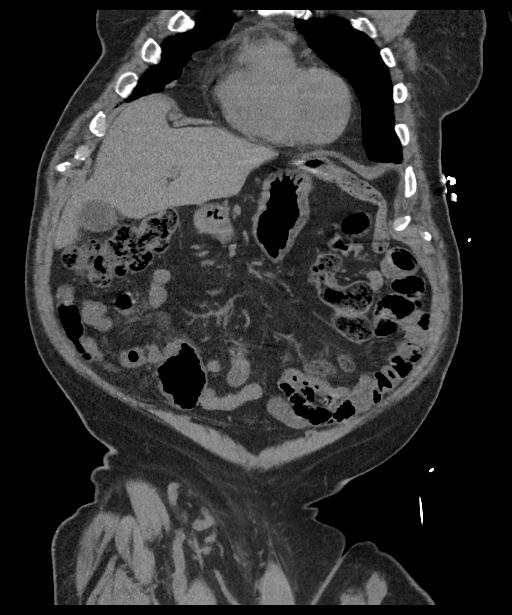
[im 70/158  soft-tissue]
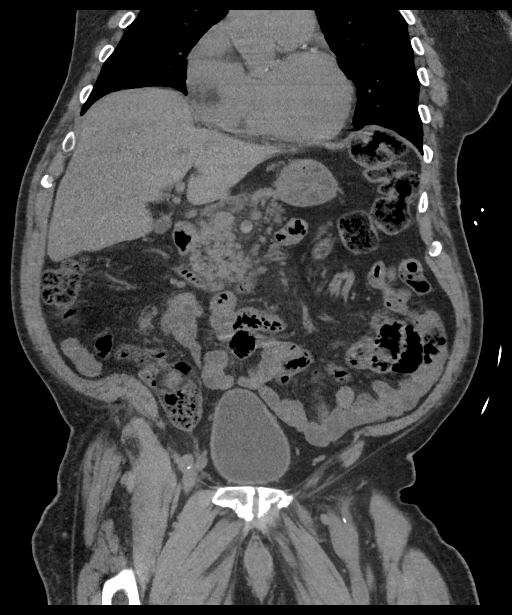
[im 88/158  soft-tissue]
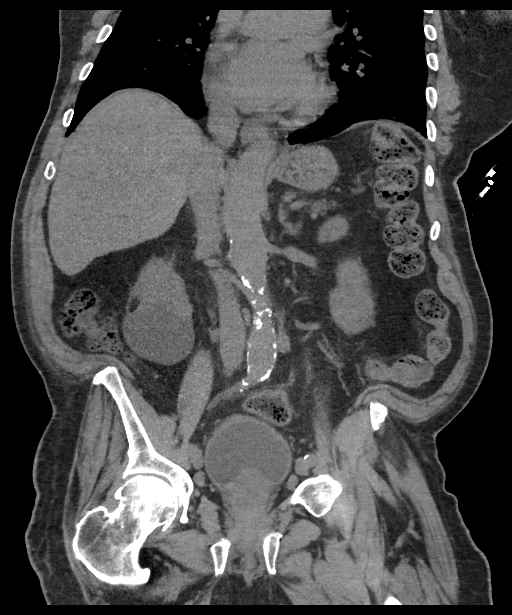

[15 of 46 positions shown; findings below may reference images not displayed]

FINDINGS: Evaluation of this exam is limited in the absence of intravenous
contrast.

Lower chest: Confluent nodular densities primarily in the left lower
lobe and to a lesser degree involving the lingula and right middle
lobe most consistent with pneumonia. Clinical correlation and
follow-up to resolution recommended. There is mild prominence of the
main pulmonary trunk suggestive of underlying pulmonary
hypertension.

There is no intra-abdominal free air or free fluid.

Hepatobiliary: Probable mild fatty infiltration of the liver. No
intrahepatic biliary ductal dilatation. There multiple stones within
the gallbladder. No pericholecystic fluid or evidence of acute
cholecystitis by CT. Ultrasound may provide better evaluation of the
gallbladder if clinically indicated.

Pancreas: Unremarkable. No pancreatic ductal dilatation or
surrounding inflammatory changes.

Spleen: Normal in size without focal abnormality.

Adrenals/Urinary Tract: There is a 15 mm left adrenal adenoma. The
right adrenal gland is unremarkable. There is no hydronephrosis or
nephrolithiasis on either side. Right renal upper pole as well as
lower pole hypodense lesions measure up to 6 cm in the inferior pole
of the right kidney. These lesions are not well characterized on
this unenhanced CT but demonstrate fluid attenuation most consistent
with cysts. The inferior pole cyst may be slightly complex with
internal septations. Ultrasound may provide better evaluation. The
urinary bladder the visualized ureters are grossly unremarkable.

Stomach/Bowel: There is redundancy of the sigmoid colon. There is
moderate amount of stool throughout the colon. There postsurgical
changes of partial small bowel resection with anastomotic suture in
the right lower quadrant. There is no bowel obstruction or active
inflammation. The appendix is not visualized, likely surgically
absent. There is a 15 mm diverticulum at the gastric fundus. No
associated inflammatory changes.

Vascular/Lymphatic: There is moderate aortoiliac atherosclerotic
disease. There is mild aneurysmal dilatation of the abdominal aorta
at the level of the renal arteries measuring up to 3.2 cm in
diameter. The IVC appears unremarkable. No portal venous gas
identified. There is no adenopathy.

Reproductive: Mild enlargement of the prostate gland with median
lobe hypertrophy indenting the base of the bladder.

Other: There is a midline vertical anterior abdominal wall
incisional scar. There is abutment of multiple loops of small bowel
to the anterior peritoneal wall along the surgical scar compatible
with adhesions.

Musculoskeletal: Osteopenia with degenerative changes of the spine
and hips. No acute osseous pathology.
IMPRESSION: 1. Pneumonia primarily involving the left lung. Correlation with
clinical exam and follow-up to resolution recommended.
2. Mildly dilated main pulmonary trunk suggestive of underlying
pulmonary hypertension.
3. Cholelithiasis.  No CT evidence to suggest acute cholecystitis.
4. Postsurgical changes of the bowel. No bowel obstruction or active
inflammation.
5. A 3.2 cm abdominal aortic aneurysm Recommend followup by
ultrasound in 3 years. This recommendation follows ACR consensus
guidelines: White Paper of the ACR Incidental Findings Committee II
6.  Aortic Atherosclerosis (0551R-A79.9).
